# Patient Record
Sex: Female | Born: 1935 | Race: White | Hispanic: No | Smoking: Never smoker
Health system: Southern US, Community
[De-identification: ages and names within clinical notes are randomized; demographics above are authoritative.]

## PROBLEM LIST (undated history)

## (undated) DIAGNOSIS — E119 Type 2 diabetes mellitus without complications: Secondary | ICD-10-CM

## (undated) DIAGNOSIS — I10 Essential (primary) hypertension: Secondary | ICD-10-CM

## (undated) DIAGNOSIS — E785 Hyperlipidemia, unspecified: Secondary | ICD-10-CM

## (undated) DIAGNOSIS — E039 Hypothyroidism, unspecified: Secondary | ICD-10-CM

## (undated) HISTORY — DX: Essential (primary) hypertension: I10

## (undated) HISTORY — PX: CATARACT EXTRACTION: SUR2

## (undated) HISTORY — PX: OTHER SURGICAL HISTORY: SHX169

## (undated) HISTORY — DX: Type 2 diabetes mellitus without complications: E11.9

## (undated) HISTORY — DX: Hyperlipidemia, unspecified: E78.5

## (undated) HISTORY — DX: Hypothyroidism, unspecified: E03.9

## (undated) HISTORY — PX: TONSILLECTOMY: SUR1361

---

## 2015-05-15 LAB — HEMOGLOBIN A1C: Hemoglobin A1C: 10.2

## 2015-07-01 ENCOUNTER — Ambulatory Visit (INDEPENDENT_AMBULATORY_CARE_PROVIDER_SITE_OTHER): Payer: Medicare Other | Admitting: "Endocrinology

## 2015-07-01 ENCOUNTER — Encounter: Payer: Self-pay | Admitting: "Endocrinology

## 2015-07-01 VITALS — BP 144/87 | HR 83 | Ht 64.0 in | Wt 209.0 lb

## 2015-07-01 DIAGNOSIS — E1165 Type 2 diabetes mellitus with hyperglycemia: Secondary | ICD-10-CM

## 2015-07-01 DIAGNOSIS — I10 Essential (primary) hypertension: Secondary | ICD-10-CM

## 2015-07-01 DIAGNOSIS — Z794 Long term (current) use of insulin: Secondary | ICD-10-CM

## 2015-07-01 DIAGNOSIS — IMO0001 Reserved for inherently not codable concepts without codable children: Secondary | ICD-10-CM | POA: Insufficient documentation

## 2015-07-01 DIAGNOSIS — E785 Hyperlipidemia, unspecified: Secondary | ICD-10-CM | POA: Diagnosis not present

## 2015-07-01 NOTE — Progress Notes (Signed)
Subjective:    Patient ID: Amber Dudley, female    DOB: 01-08-1931. Patient is being seen in consultation for management of diabetes requested by  Olen Cordial, MD  Past Medical History  Diagnosis Date  . Diabetes mellitus, type II (HCC)   . Hypothyroidism   . Hypertension   . Hyperlipidemia    Past Surgical History  Procedure Laterality Date  . Other surgical history    . Tonsillectomy    . Other surgical history      L Arm fx  . Other surgical history      L hip  . Cataract extraction     Social History   Social History  . Marital Status: Married    Spouse Name: N/A  . Number of Children: N/A  . Years of Education: N/A   Social History Main Topics  . Smoking status: Never Smoker   . Smokeless tobacco: None  . Alcohol Use: No  . Drug Use: No  . Sexual Activity: Not Asked   Other Topics Concern  . None   Social History Narrative  . None   Outpatient Encounter Prescriptions as of 07/01/2015  Medication Sig  . Cholecalciferol (VITAMIN D) 2000 units CAPS Take by mouth daily.  . furosemide (LASIX) 20 MG tablet Take 20 mg by mouth.  . Insulin Detemir (LEVEMIR FLEXTOUCH) 100 UNIT/ML Pen Inject 40 Units into the skin at bedtime.  Marland Kitchen levothyroxine (SYNTHROID, LEVOTHROID) 100 MCG tablet Take 100 mcg by mouth daily before breakfast.  . losartan (COZAAR) 100 MG tablet Take 100 mg by mouth daily.  . metFORMIN (GLUCOPHAGE) 1000 MG tablet Take 1,000 mg by mouth 2 (two) times daily with a meal.  . metoprolol succinate (TOPROL-XL) 50 MG 24 hr tablet Take 50 mg by mouth daily. Take with or immediately following a meal.  . Multiple Vitamin (MULTIVITAMIN) capsule Take 1 capsule by mouth daily.  . pravastatin (PRAVACHOL) 10 MG tablet Take 10 mg by mouth daily.  . vitamin C (ASCORBIC ACID) 500 MG tablet Take 500 mg by mouth daily.  . [DISCONTINUED] glimepiride (AMARYL) 4 MG tablet Take 4 mg by mouth 2 (two) times daily.   No facility-administered encounter medications on  file as of 07/01/2015.   ALLERGIES: No Known Allergies VACCINATION STATUS:  There is no immunization history on file for this patient.  Diabetes She presents for her initial diabetic visit. She has type 2 diabetes mellitus. Onset time: She was diagnosed at approximate age of 81 years. Her disease course has been worsening. There are no hypoglycemic associated symptoms. Pertinent negatives for hypoglycemia include no confusion, headaches, pallor or seizures. Associated symptoms include fatigue, polydipsia and polyuria. Pertinent negatives for diabetes include no chest pain and no polyphagia. There are no hypoglycemic complications. Symptoms are worsening. There are no diabetic complications. Risk factors for coronary artery disease include diabetes mellitus, dyslipidemia, hypertension, obesity and sedentary lifestyle. Her weight is stable. She is following a generally unhealthy diet. When asked about meal planning, she reported none. She has not had a previous visit with a dietitian. She never participates in exercise. Home blood sugar record trend: She did not bring any meter nor blood glucose profile to review. An ACE inhibitor/angiotensin II receptor blocker is being taken.  Hyperlipidemia This is a chronic problem. The current episode started more than 1 year ago. Pertinent negatives include no chest pain, myalgias or shortness of breath. Current antihyperlipidemic treatment includes statins. Risk factors for coronary artery disease include dyslipidemia, diabetes  mellitus, hypertension, obesity and a sedentary lifestyle.  Hypertension This is a chronic problem. The current episode started more than 1 year ago. Pertinent negatives include no chest pain, headaches, palpitations or shortness of breath. Past treatments include angiotensin blockers. Hypertensive end-organ damage includes a thyroid problem.  Thyroid Problem Presents for initial visit. Symptoms include fatigue. Patient reports no cold  intolerance, diarrhea, heat intolerance or palpitations. Past treatments include levothyroxine. The following procedures have not been performed: thyroidectomy. Her past medical history is significant for hyperlipidemia.    Review of Systems  Constitutional: Positive for fatigue. Negative for fever, chills and unexpected weight change.  HENT: Negative for trouble swallowing and voice change.   Eyes: Negative for visual disturbance.  Respiratory: Negative for cough, shortness of breath and wheezing.   Cardiovascular: Positive for leg swelling. Negative for chest pain and palpitations.  Gastrointestinal: Negative for nausea, vomiting and diarrhea.  Endocrine: Positive for polydipsia and polyuria. Negative for cold intolerance, heat intolerance and polyphagia.  Musculoskeletal: Positive for joint swelling and gait problem. Negative for myalgias and arthralgias.  Skin: Negative for color change, pallor, rash and wound.  Neurological: Negative for seizures and headaches.  Psychiatric/Behavioral: Negative for suicidal ideas and confusion.    Objective:    BP 144/87 mmHg  Pulse 83  Ht  (1.626 m)  Wt 209 lb (94.802 kg)  BMI 35.86 kg/m2  SpO2 95%  Wt Readings from Last 3 Encounters:  07/01/15 209 lb (94.802 kg)    Physical Exam  Constitutional: She is oriented to person, place, and time. She appears well-developed.  HENT:  Head: Normocephalic and atraumatic.  Eyes: EOM are normal.  Neck: Normal range of motion. Neck supple. No tracheal deviation present. No thyromegaly present.  Cardiovascular: Normal rate and regular rhythm.   Pulmonary/Chest: Effort normal and breath sounds normal.  Abdominal: Soft. Bowel sounds are normal. There is no tenderness. There is no guarding.  Musculoskeletal: Normal range of motion. She exhibits no edema.  Neurological: She is alert and oriented to person, place, and time. She has normal reflexes. No cranial nerve deficit. Coordination normal.  Skin:  Skin is warm and dry. No rash noted. No erythema. No pallor.  Psychiatric: She has a normal mood and affect. Judgment normal.      Assessment & Plan:   1. Uncontrolled type 2 diabetes mellitus without complication, with long-term current use of insulin (HCC)  - Patient has currently uncontrolled symptomatic type 2 DM since  81 years of age,  with most recent A1c of 10.1 %. Recent labs reviewed.  -patient remains at a high risk for more acute and chronic complications of diabetes which include CAD, CVA, CKD, retinopathy, and neuropathy. These are all discussed in detail with the patient.  - I have counseled the patient on diet management and weight loss, by adopting a carbohydrate restricted/protein rich diet.  - Suggestion is made for patient to avoid simple carbohydrates   from their diet including Cakes , Desserts, Ice Cream,  Soda (  diet and regular) , Sweet Tea , Candies,  Chips, Cookies, Artificial Sweeteners,   and "Sugar-free" Products . This will help patient to have stable blood glucose profile and potentially avoid unintended weight gain.  - I encouraged the patient to switch to  unprocessed or minimally processed complex starch and increased protein intake (animal or plant source), fruits, and vegetables.  - Patient is advised to stick to a routine mealtimes to eat 3 meals  a day and avoid unnecessary  snacks ( to snack only to correct hypoglycemia).  - The patient will be scheduled with Norm SaltPenny Crumpton, RDN, CDE for individualized DM education.  - I have approached patient with the following individualized plan to manage diabetes and patient agrees:   - I  will  Initiate strict monitoring of glucose  AC and HS  for 10 days. -Patient will present her meter in log to review and plan long-term care. In the meantime, I have re-adjusted her Levemir to 40 units daily at bedtime. - Patient is warned not to take insulin without proper monitoring per orders.  -Patient is encouraged to  call clinic for blood glucose levels less than 70 or above 300 mg /dl. - I will continue metformin 1000 mg by mouth twice a day, therapeutically suitable for patient.. - I will discontinue her glimepiride, risk outweighs benefit for this patient.  - Patient specific target  A1c;  LDL, HDL, Triglycerides, and  Waist Circumference were discussed in detail.  2) BP/HTN: Controlled. Continue current medications including ACEI/ARB. 3) Lipids/HPL:   continue statins. 4)  Weight/Diet: CDE Consult will be initiated , exercise, and detailed carbohydrates information provided. 5) hypothyroidism: She is clinically euthyroid I advised her to continue levothyroxine 100 g by mouth every morning.  - We discussed about correct intake of levothyroxine, at fasting, with water, separated by at least 30 minutes from breakfast, and separated by more than 4 hours from calcium, iron, multivitamins, acid reflux medications (PPIs). -Patient is made aware of the fact that thyroid hormone replacement is needed for life, dose to be adjusted by periodic monitoring of thyroid function tests.  6) Chronic Care/Health Maintenance:  -Patient is on ACEI/ARB and Statin medications and encouraged to continue to follow up with Ophthalmology, Podiatrist at least yearly or according to recommendations, and advised to   stay away from smoking. I have recommended yearly flu vaccine and pneumonia vaccination at least every 5 years; moderate intensity exercise for up to 150 minutes weekly; and  sleep for at least 7 hours a day.  - 60 minutes of time was spent on the care of this patient , 50% of which was applied for counseling on diabetes complications and their preventions.  - Patient to bring meter and  blood glucose logs during their next visit.   - I advised patient to maintain close follow up with Olen CordialHARRIS,SYDNEY M, MD for primary care needs.  Follow up plan: - Return in about 10 days (around 07/11/2015) for diabetes, high blood  pressure, high cholesterol, follow up with meter and logs- no labs.  Marquis LunchGebre , MD Phone: [phone]  Fax: [phone]   07/01/2015, 4:30 PM

## 2015-07-01 NOTE — Patient Instructions (Signed)

## 2015-07-15 ENCOUNTER — Ambulatory Visit: Payer: Medicare Other | Admitting: "Endocrinology

## 2016-03-13 ENCOUNTER — Emergency Department (HOSPITAL_COMMUNITY)
Admission: EM | Admit: 2016-03-13 | Discharge: 2016-03-13 | Disposition: A | Discharge status: Home / Self Care | Payer: Medicare Other | Attending: Emergency Medicine | Admitting: Emergency Medicine

## 2016-03-13 ENCOUNTER — Emergency Department (HOSPITAL_COMMUNITY): Payer: Medicare Other

## 2016-03-13 ENCOUNTER — Encounter (HOSPITAL_COMMUNITY): Payer: Self-pay | Admitting: Emergency Medicine

## 2016-03-13 DIAGNOSIS — E039 Hypothyroidism, unspecified: Secondary | ICD-10-CM | POA: Diagnosis not present

## 2016-03-13 DIAGNOSIS — I10 Essential (primary) hypertension: Secondary | ICD-10-CM | POA: Insufficient documentation

## 2016-03-13 DIAGNOSIS — Y999 Unspecified external cause status: Secondary | ICD-10-CM | POA: Insufficient documentation

## 2016-03-13 DIAGNOSIS — X501XXA Overexertion from prolonged static or awkward postures, initial encounter: Secondary | ICD-10-CM | POA: Insufficient documentation

## 2016-03-13 DIAGNOSIS — Z794 Long term (current) use of insulin: Secondary | ICD-10-CM | POA: Diagnosis not present

## 2016-03-13 DIAGNOSIS — E119 Type 2 diabetes mellitus without complications: Secondary | ICD-10-CM | POA: Diagnosis not present

## 2016-03-13 DIAGNOSIS — Y939 Activity, unspecified: Secondary | ICD-10-CM | POA: Diagnosis not present

## 2016-03-13 DIAGNOSIS — S40021A Contusion of right upper arm, initial encounter: Secondary | ICD-10-CM | POA: Diagnosis present

## 2016-03-13 DIAGNOSIS — Z7984 Long term (current) use of oral hypoglycemic drugs: Secondary | ICD-10-CM | POA: Insufficient documentation

## 2016-03-13 DIAGNOSIS — Z79899 Other long term (current) drug therapy: Secondary | ICD-10-CM | POA: Insufficient documentation

## 2016-03-13 DIAGNOSIS — Y929 Unspecified place or not applicable: Secondary | ICD-10-CM | POA: Diagnosis not present

## 2016-03-13 NOTE — Discharge Instructions (Signed)
Stop taking aspirin until bruising has improved. Call and make an appointment to follow-up with the orthopedics. Return immediately for worsening swelling, pain, weakness, or fever or for any concerns.

## 2016-03-13 NOTE — ED Provider Notes (Signed)
[facility]EM[facility]ENCY DEPT Provider Note   CSN: [id number] Arrival date & time: 03/13/16  1233     History   Chief Complaint Chief Complaint  Patient presents with  . Bleeding/Bruising    HPI Amber Dudley is a 82 y.o. female.  HPI Patient presents with bruising and swelling to the right upper arm. She states on Friday she was trying to raise arm and she heard a pop in her upper arm. Since that time she's had some difficulty with abduction of the right arm. She noted swelling and bruising which has extended down the arm. She has mild amount of pain. Denies any numbness or tingling. States she takes 2 baby aspirin Staley. Denies any other blood thinners. Past Medical History:  Diagnosis Date  . Diabetes mellitus, type II (HCC)   . Hyperlipidemia   . Hypertension   . Hypothyroidism     Patient Active Problem List   Diagnosis Date Noted  . Uncontrolled type 2 diabetes mellitus without complication, with long-term current use of insulin (HCC) 07/01/2015  . Essential hypertension, benign 07/01/2015  . Hyperlipidemia 07/01/2015    Past Surgical History:  Procedure Laterality Date  . CATARACT EXTRACTION    . OTHER SURGICAL HISTORY    . OTHER SURGICAL HISTORY     L hip  . OTHER SURGICAL HISTORY     L Arm fx  . TONSILLECTOMY      OB History    Gravida Para Term Preterm AB Living   3 3 3     3    SAB TAB Ectopic Multiple Live Births                   Home Medications    Prior to Admission medications   Medication Sig Start Date End Date Taking? Authorizing Provider  Cholecalciferol (VITAMIN D) 2000 units CAPS Take by mouth daily.    Historical Provider, MD  furosemide (LASIX) 20 MG tablet Take 20 mg by mouth.    Historical Provider, MD  Insulin Detemir (LEVEMIR FLEXTOUCH) 100 UNIT/ML Pen Inject 40 Units into the skin at bedtime.    Historical Provider, MD  levothyroxine (SYNTHROID, LEVOTHROID) 100 MCG tablet Take 100 mcg by mouth daily before breakfast.    Historical  Provider, MD  losartan (COZAAR) 100 MG tablet Take 100 mg by mouth daily.    Historical Provider, MD  metFORMIN (GLUCOPHAGE) 1000 MG tablet Take 1,000 mg by mouth 2 (two) times daily with a meal.    Historical Provider, MD  metoprolol succinate (TOPROL-XL) 50 MG 24 hr tablet Take 50 mg by mouth daily. Take with or immediately following a meal.    Historical Provider, MD  Multiple Vitamin (MULTIVITAMIN) capsule Take 1 capsule by mouth daily.    Historical Provider, MD  pravastatin (PRAVACHOL) 10 MG tablet Take 10 mg by mouth daily.    Historical Provider, MD  vitamin C (ASCORBIC ACID) 500 MG tablet Take 500 mg by mouth daily.    Historical Provider, MD    Family History Family History  Problem Relation Age of Onset  . Heart failure Mother   . Heart disease Father   . Stroke Father   . Diabetes Sister   . Hypertension Sister   . Heart failure Sister   . Cancer Sister     Social History Social History  Substance Use Topics  . Smoking status: Never Smoker  . Smokeless tobacco: Never Used  . Alcohol use No     Allergies   Review  of patient's allergies indicates no known allergies.   Review of Systems Review of Systems  Constitutional: Negative for chills and fever.  Respiratory: Negative for shortness of breath.   Cardiovascular: Negative for chest pain.  Gastrointestinal: Negative for abdominal pain and nausea.  Musculoskeletal: Positive for arthralgias. Negative for neck pain.  Neurological: Positive for weakness. Negative for dizziness and numbness.  Hematological: Bruises/bleeds easily.  All other systems reviewed and are negative.    Physical Exam Updated Vital Signs BP 178/89 (BP Location: Left Arm)   Pulse 84   Temp 97.7 F (36.5 C) (Oral)   Resp 20   Ht 5\' 4"  (1.626 m)   Wt 190 lb (86.2 kg)   SpO2 100%   BMI 32.61 kg/m   Physical Exam  Constitutional: She is oriented to person, place, and time. She appears well-developed and well-nourished. No distress.   HENT:  Head: Normocephalic and atraumatic.  Mouth/Throat: Oropharynx is clear and moist.  Eyes: EOM are normal. Pupils are equal, round, and reactive to light.  Neck: Normal range of motion. Neck supple.  Cardiovascular: Normal rate and regular rhythm.   Pulmonary/Chest: Effort normal and breath sounds normal.  Abdominal: Soft. Bowel sounds are normal. There is no tenderness. There is no rebound and no guarding.  Musculoskeletal: Normal range of motion. She exhibits edema and tenderness.  Patient has swelling to the upper part of the right arm at the level of the deltoid. There is some yellow ecchymosis present. More distally down the arm to the elbow patient has purple discoloration. Compartments are soft. Distal pulses are 2+. Patient has some weakness to abduction of the right arm against resistance. Appears to have full range of motion. No bony tenderness to palpation. Full range of motion of the right elbow and wrist.  Neurological: She is alert and oriented to person, place, and time.  Sensation is completely intact. Decreased strength against resistance to abduction of the right arm. Normal flexion and extension of the right elbow and wrist.  Skin: Skin is warm and dry. Capillary refill takes less than 2 seconds. No rash noted. No erythema.  Psychiatric: She has a normal mood and affect. Her behavior is normal.  Nursing note and vitals reviewed.    ED Treatments / Results  Labs (all labs ordered are listed, but only abnormal results are displayed) Labs Reviewed - No data to display  EKG  EKG Interpretation None       Radiology Dg Humerus Right  Result Date: 03/13/2016 CLINICAL DATA:  Bruising to right bicep after lifting injury. EXAM: RIGHT HUMERUS - 2+ VIEW COMPARISON:  None. FINDINGS: No evidence for humerus fracture. Degenerative changes are noted in the shoulder. Bones are diffusely demineralized. IMPRESSION: Negative. Electronically Signed   By: Kennith CenterEric  Mansell M.D.   On:  03/13/2016 13:13    Procedures Procedures (including critical care time)  Medications Ordered in ED Medications - No data to display   Initial Impression / Assessment and Plan / ED Course  I have reviewed the triage vital signs and the nursing notes.  Pertinent labs & imaging results that were available during my care of the patient were reviewed by me and considered in my medical decision making (see chart for details).  Clinical Course    Patient presents with hematoma and ecchymosis to the right arm. She has have some abduction weakness. Concern for possible rotator cuff injury with hematoma development. X-ray without any obvious bony injuries. Will advise patient holding her aspirin until her  symptoms have resolved. We'll give with a follow-up and return precautions.  Final Clinical Impressions(s) / ED Diagnoses   Final diagnoses:  Hematoma of arm, right, initial encounter    New Prescriptions New Prescriptions   No medications on file     Loren Racer, MD 03/13/16 1405

## 2016-03-13 NOTE — ED Triage Notes (Signed)
Patient c/o bruising to right bicep. Per patient lifted arm on Friday and felt pop. Large bruising noted. Patient has difficulty raising arm above head due to pain. Patient states only type of blood thinner is aspirin.

## 2016-03-15 ENCOUNTER — Encounter: Payer: Self-pay | Admitting: Orthopaedic Surgery

## 2016-03-15 ENCOUNTER — Ambulatory Visit (INDEPENDENT_AMBULATORY_CARE_PROVIDER_SITE_OTHER): Payer: Medicare Other | Admitting: Orthopaedic Surgery

## 2016-03-15 VITALS — BP 147/83 | HR 96 | Ht 62.0 in | Wt 185.0 lb

## 2016-03-15 DIAGNOSIS — S46211A Strain of muscle, fascia and tendon of other parts of biceps, right arm, initial encounter: Secondary | ICD-10-CM

## 2016-03-15 DIAGNOSIS — S46111A Strain of muscle, fascia and tendon of long head of biceps, right arm, initial encounter: Secondary | ICD-10-CM

## 2016-03-15 NOTE — Progress Notes (Signed)
Subjective: My right upper arm hurts and has bruising    Patient ID: Amber ComasPatsy V Dudley, female    DOB: 09/03/1930, 82 y.o.   MRN: [id number]  HPI She fell at home about a month ago and hurt her right arm.  She had some pain but then did well.  Two weeks ago she tripped again and fell and hit her left knee which she was carrying a lot of packages.  She fell in her doorway.  She discovered she has a depression from the ramp that goes into her house and it has dropped about an inch at the doorway causing her to fall.  She is looking into getting it fixed.  Two weeks ago she noted some slight swelling of the upper arm  She "babied" it and it did not hurt.  This past Sunday, two days ago, she had more pain and noticed a lot of bruising of the upper arm and the elbow area that was not present the day before.  She went to the ER. X-rays were done and were negative.  She is referred here.  I have reviewed the ER reports, the x-rays and x-ray report.  She has pain when using the right upper arm, more in the mid upper arm and more over the biceps muscle. She has some pain of the right shoulder.  She cannot raise the arm upwards.  She has a sling and has used ice.  She takes two baby aspirins a day and has cut back to one a day.  She has no other injury.   Review of Systems  HENT: Negative for congestion.   Respiratory: Negative for cough and shortness of breath.   Cardiovascular: Negative for chest pain and leg swelling.  Endocrine: Positive for cold intolerance.  Musculoskeletal: Positive for arthralgias.  Allergic/Immunologic: Positive for environmental allergies.   Past Medical History:  Diagnosis Date  . Diabetes mellitus, type II (HCC)   . Hyperlipidemia   . Hypertension   . Hypothyroidism     Past Surgical History:  Procedure Laterality Date  . CATARACT EXTRACTION    . OTHER SURGICAL HISTORY    . OTHER SURGICAL HISTORY     L hip  . OTHER SURGICAL HISTORY     L Arm fx  .  TONSILLECTOMY      Current Outpatient Prescriptions on File Prior to Visit  Medication Sig Dispense Refill  . Cholecalciferol (VITAMIN D) 2000 units CAPS Take by mouth daily.    . furosemide (LASIX) 20 MG tablet Take 20 mg by mouth.    . Insulin Detemir (LEVEMIR FLEXTOUCH) 100 UNIT/ML Pen Inject 40 Units into the skin at bedtime.    Marland Kitchen. levothyroxine (SYNTHROID, LEVOTHROID) 100 MCG tablet Take 100 mcg by mouth daily before breakfast.    . losartan (COZAAR) 100 MG tablet Take 100 mg by mouth daily.    . metFORMIN (GLUCOPHAGE) 1000 MG tablet Take 1,000 mg by mouth 2 (two) times daily with a meal.    . metoprolol succinate (TOPROL-XL) 50 MG 24 hr tablet Take 50 mg by mouth daily. Take with or immediately following a meal.    . Multiple Vitamin (MULTIVITAMIN) capsule Take 1 capsule by mouth daily.    . pravastatin (PRAVACHOL) 10 MG tablet Take 10 mg by mouth daily.    . vitamin C (ASCORBIC ACID) 500 MG tablet Take 500 mg by mouth daily.     No current facility-administered medications on file prior to visit.  Social History   Social History  . Marital status: Widowed    Spouse name: N/A  . Number of children: N/A  . Years of education: N/A   Occupational History  . Not on file.   Social History Main Topics  . Smoking status: Never Smoker  . Smokeless tobacco: Never Used  . Alcohol use No  . Drug use: No  . Sexual activity: Not on file   Other Topics Concern  . Not on file   Social History Narrative  . No narrative on file    Family History  Problem Relation Age of Onset  . Heart failure Mother   . Heart disease Father   . Stroke Father   . Diabetes Sister   . Hypertension Sister   . Heart failure Sister   . Cancer Sister     BP (!) 147/83   Pulse 96   Ht 5\' 2"  (1.575 m)   Wt 185 lb (83.9 kg)   BMI 33.84 kg/m      Objective:   Physical Exam  Constitutional: She is oriented to person, place, and time. She appears well-developed and well-nourished.  HENT:   Head: Normocephalic and atraumatic.  Eyes: Conjunctivae and EOM are normal. Pupils are equal, round, and reactive to light.  Neck: Normal range of motion. Neck supple.  Cardiovascular: Normal rate, regular rhythm and intact distal pulses.   Pulmonary/Chest: Effort normal.  Abdominal: Soft.  Musculoskeletal: She exhibits tenderness (The right upper arm has significiant ecchymosis extending to the elbow but not below.  NV intact. ROM of elbow full but shoulder tender past 90 forward and abduction 75.  Grips are normal.  No defect felt.  Biceps muscle tender mid portion  & shoulder).  Neurological: She is alert and oriented to person, place, and time. She displays normal reflexes. No cranial nerve deficit. She exhibits normal muscle tone. Coordination normal.  Skin: Skin is warm and dry.  Psychiatric: She has a normal mood and affect. Her behavior is normal. Judgment and thought content normal.          Assessment & Plan:   Encounter Diagnosis  Name Primary?  . Biceps tendon rupture, right, initial encounter Yes   I have explained what the diagnosis is and why I say this.  I have told her that surgery is not usually necessary.  She needs to let it "calm down".  She is to use her sling and ice.  I will see her in two weeks.  At that time, she may need PT.  She might need MRI in the future.  Call if any problem.  Precautions discussed.  Electronically Signed Darreld Mclean, MD 9/12/201710:54 AM

## 2016-03-29 ENCOUNTER — Ambulatory Visit (INDEPENDENT_AMBULATORY_CARE_PROVIDER_SITE_OTHER): Payer: Medicare Other | Admitting: Orthopaedic Surgery

## 2016-03-29 ENCOUNTER — Encounter: Payer: Self-pay | Admitting: Orthopaedic Surgery

## 2016-03-29 VITALS — BP 151/83 | HR 67 | Temp 97.3°F | Ht 61.0 in | Wt 181.0 lb

## 2016-03-29 DIAGNOSIS — S46111A Strain of muscle, fascia and tendon of long head of biceps, right arm, initial encounter: Secondary | ICD-10-CM | POA: Diagnosis not present

## 2016-03-29 DIAGNOSIS — S46211A Strain of muscle, fascia and tendon of other parts of biceps, right arm, initial encounter: Secondary | ICD-10-CM

## 2016-03-29 NOTE — Progress Notes (Signed)
Patient [name], female DOB:[birth year], 82 y.o. JYN:[id number]  Chief Complaint  Patient presents with  . Follow-up    right arm    HPI  Amber Dudley is a 82 y.o. female who has right biceps tendon rupture.  She is better.  She has resolved ecchymosis.  She has been doing some home exercises.  She would rather continue them than go to PT/OT now.  I have shown her about walking her fingers up the wall.  She has no paresthesias.  She has more pain at night. HPI  Body mass index is 34.2 kg/m.  ROS  Review of Systems  HENT: Negative for congestion.   Respiratory: Negative for cough and shortness of breath.   Cardiovascular: Negative for chest pain and leg swelling.  Endocrine: Positive for cold intolerance.  Musculoskeletal: Positive for arthralgias.  Allergic/Immunologic: Positive for environmental allergies.    Past Medical History:  Diagnosis Date  . Diabetes mellitus, type II (HCC)   . Hyperlipidemia   . Hypertension   . Hypothyroidism     Past Surgical History:  Procedure Laterality Date  . CATARACT EXTRACTION    . OTHER SURGICAL HISTORY    . OTHER SURGICAL HISTORY     L hip  . OTHER SURGICAL HISTORY     L Arm fx  . TONSILLECTOMY      Family History  Problem Relation Age of Onset  . Heart failure Mother   . Heart disease Father   . Stroke Father   . Diabetes Sister   . Hypertension Sister   . Heart failure Sister   . Cancer Sister     Social History Social History  Substance Use Topics  . Smoking status: Never Smoker  . Smokeless tobacco: Never Used  . Alcohol use No    No Known Allergies  Current Outpatient Prescriptions  Medication Sig Dispense Refill  . Cholecalciferol (VITAMIN D) 2000 units CAPS Take by mouth daily.    . furosemide (LASIX) 20 MG tablet Take 20 mg by mouth.    . Insulin Detemir (LEVEMIR FLEXTOUCH) 100 UNIT/ML Pen Inject 40 Units into the skin at bedtime.    Marland Kitchen levothyroxine (SYNTHROID, LEVOTHROID) 100 MCG tablet Take  100 mcg by mouth daily before breakfast.    . losartan (COZAAR) 100 MG tablet Take 100 mg by mouth daily.    . metFORMIN (GLUCOPHAGE) 1000 MG tablet Take 1,000 mg by mouth 2 (two) times daily with a meal.    . metoprolol succinate (TOPROL-XL) 50 MG 24 hr tablet Take 50 mg by mouth daily. Take with or immediately following a meal.    . Multiple Vitamin (MULTIVITAMIN) capsule Take 1 capsule by mouth daily.    . pravastatin (PRAVACHOL) 10 MG tablet Take 10 mg by mouth daily.    . vitamin C (ASCORBIC ACID) 500 MG tablet Take 500 mg by mouth daily.     No current facility-administered medications for this visit.      Physical Exam  Blood pressure (!) 151/83, pulse 67, temperature 97.3 F (36.3 C), height 5\' 1"  (1.549 m), weight 181 lb (82.1 kg).  Constitutional: overall normal hygiene, normal nutrition, well developed, normal grooming, normal body habitus. Assistive device:none  Musculoskeletal: gait and station Limp none, muscle tone and strength are normal, no tremors or atrophy is present.  .  Neurological: coordination overall normal.  Deep tendon reflex/nerve stretch intact.  Sensation normal.  Cranial nerves II-XII intact.   Skin:   Normal overall no scars, lesions,  ulcers or rashes. No psoriasis.  Psychiatric: Alert and oriented x 3.  Recent memory intact, remote memory unclear.  Normal mood and affect. Well groomed.  Good eye contact.  Cardiovascular: overall no swelling, no varicosities, no edema bilaterally, normal temperatures of the legs and arms, no clubbing, cyanosis and good capillary refill.  Lymphatic: palpation is normal.  She has resolving ecchymosis around the elbow on the right.  She has normal NV status. She lacks full extension of elbow by about 5 degrees.  ROM of the shoulder is tender and decreased.  The patient has been educated about the nature of the problem(s) and counseled on treatment options.  The patient appeared to understand what I have discussed and is  in agreement with it.  Encounter Diagnosis  Name Primary?  . Biceps tendon rupture, right, initial encounter Yes    PLAN Call if any problems.  Precautions discussed.  Continue current medications.   Return to clinic 2 weeks   Electronically Signed Darreld McleanWayne , MD 9/26/201711:32 AM

## 2016-04-12 ENCOUNTER — Ambulatory Visit (INDEPENDENT_AMBULATORY_CARE_PROVIDER_SITE_OTHER): Payer: Medicare Other | Admitting: Orthopaedic Surgery

## 2016-04-12 ENCOUNTER — Encounter: Payer: Self-pay | Admitting: Orthopaedic Surgery

## 2016-04-12 VITALS — BP 159/62 | HR 75 | Ht 61.5 in | Wt 186.0 lb

## 2016-04-12 DIAGNOSIS — S46211A Strain of muscle, fascia and tendon of other parts of biceps, right arm, initial encounter: Secondary | ICD-10-CM | POA: Diagnosis not present

## 2016-04-12 NOTE — Progress Notes (Signed)
Patient Amber Dudley, female DOB:[birth year], 82 y.o. AVW:[id number]  Chief Complaint  Patient presents with  . Follow-up    Right biceps tendon    HPI  Amber Dudley is a 82 y.o. female who has had right biceps tendon rupture. She is doing very well.  She has no swelling now and the ecchymosis has resolved.  She has no new trauma. HPI  Body mass index is 34.58 kg/m.  ROS  Review of Systems  HENT: Negative for congestion.   Respiratory: Negative for cough and shortness of breath.   Cardiovascular: Negative for chest pain and leg swelling.  Endocrine: Positive for cold intolerance.  Musculoskeletal: Positive for arthralgias.  Allergic/Immunologic: Positive for environmental allergies.    Past Medical History:  Diagnosis Date  . Diabetes mellitus, type II (HCC)   . Hyperlipidemia   . Hypertension   . Hypothyroidism     Past Surgical History:  Procedure Laterality Date  . CATARACT EXTRACTION    . OTHER SURGICAL HISTORY    . OTHER SURGICAL HISTORY     L hip  . OTHER SURGICAL HISTORY     L Arm fx  . TONSILLECTOMY      Family History  Problem Relation Age of Onset  . Heart failure Mother   . Heart disease Father   . Stroke Father   . Diabetes Sister   . Hypertension Sister   . Heart failure Sister   . Cancer Sister     Social History Social History  Substance Use Topics  . Smoking status: Never Smoker  . Smokeless tobacco: Never Used  . Alcohol use No    No Known Allergies  Current Outpatient Prescriptions  Medication Sig Dispense Refill  . Cholecalciferol (VITAMIN D) 2000 units CAPS Take by mouth daily.    . furosemide (LASIX) 20 MG tablet Take 20 mg by mouth.    . Insulin Detemir (LEVEMIR FLEXTOUCH) 100 UNIT/ML Pen Inject 40 Units into the skin at bedtime.    Marland Kitchen levothyroxine (SYNTHROID, LEVOTHROID) 100 MCG tablet Take 100 mcg by mouth daily before breakfast.    . losartan (COZAAR) 100 MG tablet Take 100 mg by mouth daily.    . metFORMIN  (GLUCOPHAGE) 1000 MG tablet Take 1,000 mg by mouth 2 (two) times daily with a meal.    . metoprolol succinate (TOPROL-XL) 50 MG 24 hr tablet Take 50 mg by mouth daily. Take with or immediately following a meal.    . Multiple Vitamin (MULTIVITAMIN) capsule Take 1 capsule by mouth daily.    . pravastatin (PRAVACHOL) 10 MG tablet Take 10 mg by mouth daily.    . vitamin C (ASCORBIC ACID) 500 MG tablet Take 500 mg by mouth daily.     No current facility-administered medications for this visit.      Physical Exam  Blood pressure (!) 159/62, pulse 75, height 5' 1.5" (1.562 m), weight 186 lb (84.4 kg).  Constitutional: overall normal hygiene, normal nutrition, well developed, normal grooming, normal body habitus. Assistive device:none  Musculoskeletal: gait and station Limp none, muscle tone and strength are normal, no tremors or atrophy is present.  .  Neurological: coordination overall normal.  Deep tendon reflex/nerve stretch intact.  Sensation normal.  Cranial nerves II-XII intact.   Skin:   Normal overall no scars, lesions, ulcers or rashes. No psoriasis.  Psychiatric: Alert and oriented x 3.  Recent memory intact, remote memory unclear.  Normal mood and affect. Well groomed.  Good eye contact.  Cardiovascular: overall  no swelling, no varicosities, no edema bilaterally, normal temperatures of the legs and arms, no clubbing, cyanosis and good capillary refill.  Lymphatic: palpation is normal.  Her right upper arm has much less pain.  She has near full ROM of the right shoulder.  She has no ecchymosis now of the right upper arm and elbow. ROM of the elbow is full.  NV intact.  The patient has been educated about the nature of the problem(s) and counseled on treatment options.  The patient appeared to understand what I have discussed and is in agreement with it.  Encounter Diagnosis  Name Primary?  . Biceps tendon rupture, right, initial encounter Yes    PLAN Call if any problems.   Precautions discussed.  Continue current medications.   Return to clinic 1 month   Electronically Signed Darreld McleanWayne , MD 10/10/201710:58 AM

## 2016-05-10 ENCOUNTER — Encounter: Payer: Self-pay | Admitting: Orthopaedic Surgery

## 2016-05-10 ENCOUNTER — Ambulatory Visit (INDEPENDENT_AMBULATORY_CARE_PROVIDER_SITE_OTHER): Payer: Medicare Other | Admitting: Orthopaedic Surgery

## 2016-05-10 VITALS — BP 144/69 | HR 73 | Temp 97.2°F | Ht 61.5 in | Wt 183.0 lb

## 2016-05-10 DIAGNOSIS — S46211D Strain of muscle, fascia and tendon of other parts of biceps, right arm, subsequent encounter: Secondary | ICD-10-CM | POA: Diagnosis not present

## 2016-05-10 NOTE — Progress Notes (Signed)
Patient QM:VHQIO:Amber Dudley, female DOB:06/04/1931, 82 y.o. NGE:[id number]  Chief Complaint  Patient presents with  . Follow-up    Right Biceps Tendon    HPI  Amber Dudley is a 82 y.o. female who has a biceps tendon rupture on the right. She is doing well now. She has regained most of her strength and motion. She has no pain.   HPI  Body mass index is 34.02 kg/m.  ROS  Review of Systems  HENT: Negative for congestion.   Respiratory: Negative for cough and shortness of breath.   Cardiovascular: Negative for chest pain and leg swelling.  Endocrine: Positive for cold intolerance.  Musculoskeletal: Positive for arthralgias.  Allergic/Immunologic: Positive for environmental allergies.    Past Medical History:  Diagnosis Date  . Diabetes mellitus, type II (HCC)   . Hyperlipidemia   . Hypertension   . Hypothyroidism     Past Surgical History:  Procedure Laterality Date  . CATARACT EXTRACTION    . OTHER SURGICAL HISTORY    . OTHER SURGICAL HISTORY     L hip  . OTHER SURGICAL HISTORY     L Arm fx  . TONSILLECTOMY      Family History  Problem Relation Age of Onset  . Heart failure Mother   . Heart disease Father   . Stroke Father   . Diabetes Sister   . Hypertension Sister   . Heart failure Sister   . Cancer Sister     Social History Social History  Substance Use Topics  . Smoking status: Never Smoker  . Smokeless tobacco: Never Used  . Alcohol use No    No Known Allergies  Current Outpatient Prescriptions  Medication Sig Dispense Refill  . Cholecalciferol (VITAMIN D) 2000 units CAPS Take by mouth daily.    . furosemide (LASIX) 20 MG tablet Take 20 mg by mouth.    . Insulin Detemir (LEVEMIR FLEXTOUCH) 100 UNIT/ML Pen Inject 40 Units into the skin at bedtime.    Marland Kitchen. levothyroxine (SYNTHROID, LEVOTHROID) 100 MCG tablet Take 100 mcg by mouth daily before breakfast.    . losartan (COZAAR) 100 MG tablet Take 100 mg by mouth daily.    . metFORMIN (GLUCOPHAGE) 1000  MG tablet Take 1,000 mg by mouth 2 (two) times daily with a meal.    . metoprolol succinate (TOPROL-XL) 50 MG 24 hr tablet Take 50 mg by mouth daily. Take with or immediately following a meal.    . Multiple Vitamin (MULTIVITAMIN) capsule Take 1 capsule by mouth daily.    . pravastatin (PRAVACHOL) 10 MG tablet Take 10 mg by mouth daily.    . vitamin C (ASCORBIC ACID) 500 MG tablet Take 500 mg by mouth daily.     No current facility-administered medications for this visit.      Physical Exam  Blood pressure (!) 144/69, pulse 73, temperature 97.2 F (36.2 C), height 5' 1.5" (1.562 m), weight 183 lb (83 kg).  Constitutional: overall normal hygiene, normal nutrition, well developed, normal grooming, normal body habitus. Assistive device:none  Musculoskeletal: gait and station Limp none, muscle tone and strength are normal, no tremors or atrophy is present.  .  Neurological: coordination overall normal.  Deep tendon reflex/nerve stretch intact.  Sensation normal.  Cranial nerves II-XII intact.   Skin:   Normal overall no scars, lesions, ulcers or rashes. No psoriasis.  Psychiatric: Alert and oriented x 3.  Recent memory intact, remote memory unclear.  Normal mood and affect. Well groomed.  Good  eye contact.  Cardiovascular: overall no swelling, no varicosities, no edema bilaterally, normal temperatures of the legs and arms, no clubbing, cyanosis and good capillary refill.  Lymphatic: palpation is normal.  She has full motion of the right upper extremity and no pain. NV is intact.  Neck has full motion.  The patient has been educated about the nature of the problem(s) and counseled on treatment options.  The patient appeared to understand what I have discussed and is in agreement with it.  Encounter Diagnosis  Name Primary?  . Rupture of right biceps tendon, subsequent encounter Yes    PLAN Call if any problems.  Precautions discussed.  Continue current medications.   Return to  clinic PRN   Electronically Signed Darreld McleanWayne , MD 11/7/201711:12 AM

## 2017-08-19 ENCOUNTER — Other Ambulatory Visit: Payer: Self-pay

## 2017-08-19 ENCOUNTER — Emergency Department (HOSPITAL_COMMUNITY)
Admission: EM | Admit: 2017-08-19 | Discharge: 2017-08-19 | Disposition: A | Discharge status: Home / Self Care | Payer: Medicare Other | Attending: Emergency Medicine | Admitting: Emergency Medicine

## 2017-08-19 ENCOUNTER — Encounter (HOSPITAL_COMMUNITY): Payer: Self-pay | Admitting: Emergency Medicine

## 2017-08-19 DIAGNOSIS — E039 Hypothyroidism, unspecified: Secondary | ICD-10-CM | POA: Diagnosis not present

## 2017-08-19 DIAGNOSIS — J019 Acute sinusitis, unspecified: Secondary | ICD-10-CM | POA: Insufficient documentation

## 2017-08-19 DIAGNOSIS — Y999 Unspecified external cause status: Secondary | ICD-10-CM | POA: Diagnosis not present

## 2017-08-19 DIAGNOSIS — W0110XA Fall on same level from slipping, tripping and stumbling with subsequent striking against unspecified object, initial encounter: Secondary | ICD-10-CM | POA: Diagnosis not present

## 2017-08-19 DIAGNOSIS — E119 Type 2 diabetes mellitus without complications: Secondary | ICD-10-CM | POA: Diagnosis not present

## 2017-08-19 DIAGNOSIS — Z7982 Long term (current) use of aspirin: Secondary | ICD-10-CM | POA: Insufficient documentation

## 2017-08-19 DIAGNOSIS — Y929 Unspecified place or not applicable: Secondary | ICD-10-CM | POA: Insufficient documentation

## 2017-08-19 DIAGNOSIS — S0101XA Laceration without foreign body of scalp, initial encounter: Secondary | ICD-10-CM | POA: Diagnosis not present

## 2017-08-19 DIAGNOSIS — I10 Essential (primary) hypertension: Secondary | ICD-10-CM | POA: Insufficient documentation

## 2017-08-19 DIAGNOSIS — Z794 Long term (current) use of insulin: Secondary | ICD-10-CM | POA: Insufficient documentation

## 2017-08-19 DIAGNOSIS — Y9301 Activity, walking, marching and hiking: Secondary | ICD-10-CM | POA: Diagnosis not present

## 2017-08-19 DIAGNOSIS — S0990XA Unspecified injury of head, initial encounter: Secondary | ICD-10-CM | POA: Diagnosis present

## 2017-08-19 DIAGNOSIS — J329 Chronic sinusitis, unspecified: Secondary | ICD-10-CM

## 2017-08-19 MED ORDER — HYDROGEN PEROXIDE 3 % EX SOLN
CUTANEOUS | Status: AC
  Filled 2017-08-19: qty 473

## 2017-08-19 MED ORDER — AMOXICILLIN 500 MG PO CAPS: 500.0000 mg | ORAL_CAPSULE | Freq: Three times a day (TID) | ORAL | 0 refills | Status: DC

## 2017-08-19 MED ORDER — LIDOCAINE-EPINEPHRINE (PF) 2 %-1:200000 IJ SOLN
10.0000 mL | Freq: Once | INTRAMUSCULAR | Status: AC
  Administered 2017-08-19: 10 mL
  Filled 2017-08-19: qty 20

## 2017-08-19 MED ORDER — TRAMADOL HCL 50 MG PO TABS
50.0000 mg | ORAL_TABLET | Freq: Once | ORAL | Status: AC
  Administered 2017-08-19: 50 mg via ORAL
  Filled 2017-08-19: qty 1

## 2017-08-19 NOTE — Discharge Instructions (Signed)
[facility] out in 10 days

## 2017-08-19 NOTE — ED Triage Notes (Signed)
Pt reports she fell against clothes rack this morning and has a laceration to L scalp. Denies LOC or blood thinner use. Pt drove herself and ambulated to triage. Oozing noted.

## 2017-08-19 NOTE — ED Provider Notes (Signed)
[facility] EMERGENCY DEPARTMENT Provider Note   CSN: [id number] Arrival date & time: 08/19/17  1246     History   Chief Complaint No chief complaint on file.   HPI Amber Dudley is a 84 y.o. female.  HPI Patient presents after hitting her head this morning.  States she lost her balance and hit her head on a close rack.  Laceration to left temporal parietal area.  Had a whole lot of bleeding at home but has since stopped.  States she went to an urgent care and was told that they could not take care of her.  No confusion.  No nausea or vomiting.  She has not on anticoagulation.  Fall happened around 3 hours ago.  Last tetanus is under 10 years ago. Also complaining of pain and congestion in her left sinus area.  States it goes down to her teeth.  States she has had that for the last few weeks.  No fevers. Past Medical History:  Diagnosis Date  . Diabetes mellitus, type II (HCC)   . Hyperlipidemia   . Hypertension   . Hypothyroidism     Patient Active Problem List   Diagnosis Date Noted  . Uncontrolled type 2 diabetes mellitus without complication, with long-term current use of insulin (HCC) 07/01/2015  . Essential hypertension, benign 07/01/2015  . Hyperlipidemia 07/01/2015    Past Surgical History:  Procedure Laterality Date  . CATARACT EXTRACTION    . OTHER SURGICAL HISTORY    . OTHER SURGICAL HISTORY     L hip  . OTHER SURGICAL HISTORY     L Arm fx  . TONSILLECTOMY      OB History    Gravida Para Term Preterm AB Living   3 3 3     3    SAB TAB Ectopic Multiple Live Births                   Home Medications    Prior to Admission medications   Medication Sig Start Date End Date Taking? Authorizing Provider  aspirin EC 81 MG tablet Take 162 mg by mouth daily.   Yes [provider]  benzonatate (TESSALON) 100 MG capsule Take 1 capsule by mouth 3 (three) times daily as needed. 08/16/17  Yes [provider]  Cholecalciferol (VITAMIN D) 2000  units CAPS Take by mouth daily.   Yes [provider]  Cyanocobalamin (VITAMIN B 12) 100 MCG LOZG Take 500 mg by mouth daily.   Yes [provider]  furosemide (LASIX) 20 MG tablet Take 20 mg by mouth.   Yes [provider]  glimepiride (AMARYL) 2 MG tablet Take 0.5 tablets by mouth daily. 08/12/17  Yes [provider]  Insulin Detemir (LEVEMIR FLEXTOUCH) 100 UNIT/ML Pen Inject 15-20 Units into the skin 2 (two) times daily.    Yes [provider]  levothyroxine (SYNTHROID, LEVOTHROID) 125 MCG tablet Take 0.5 tablets by mouth daily before breakfast.  07/18/17  Yes [provider]  losartan (COZAAR) 100 MG tablet Take 100 mg by mouth daily.   Yes [provider]  metFORMIN (GLUCOPHAGE) 1000 MG tablet Take 1,000 mg by mouth 2 (two) times daily with a meal.   Yes [provider]  metoprolol succinate (TOPROL-XL) 50 MG 24 hr tablet Take 50 mg by mouth daily. Take with or immediately following a meal.   Yes [provider]  Multiple Vitamin (MULTIVITAMIN) capsule Take 1 capsule by mouth daily.   Yes [provider]  pravastatin (PRAVACHOL) 10 MG tablet Take 10 mg by mouth every other day.    Yes [provider]  vitamin C (ASCORBIC ACID) 500 MG tablet Take 500 mg by mouth daily.   Yes [provider]  amoxicillin (AMOXIL) 500 MG capsule Take 1 capsule (500 mg total) by mouth 3 (three) times daily. 08/19/17   Benjiman Core, MD    Family History Family History  Problem Relation Age of Onset  . Heart failure Mother   . Heart disease Father   . Stroke Father   . Diabetes Sister   . Hypertension Sister   . Heart failure Sister   . Cancer Sister     Social History Social History   Tobacco Use  . Smoking status: Never Smoker  . Smokeless tobacco: Never Used  Substance Use Topics  . Alcohol use: No    Alcohol/week: 0.0 oz  . Drug use: No     Allergies   Patient has no known  allergies.   Review of Systems Review of Systems  Constitutional: Negative for appetite change.  HENT: Positive for congestion. Negative for dental problem, facial swelling and trouble swallowing.        Left facial pain.  Respiratory: Negative for chest tightness.   Cardiovascular: Negative for chest pain.  Gastrointestinal: Negative for abdominal pain.  Musculoskeletal: Negative for neck pain.  Neurological: Negative for syncope and headaches.     Physical Exam Updated Vital Signs BP (!) 124/100 (BP Location: Right Arm)   Pulse 78   Temp 98.2 F (36.8 C) (Oral)   Resp 14   Ht 5\' 4"  (1.626 m)   Wt 83.9 kg (185 lb)   SpO2 95%   BMI 31.76 kg/m   Physical Exam  Constitutional: She appears well-developed.  HENT:  Approximately 3 cm laceration to left parietal area on scalp.  No underlying bony tenderness.  Temporal over bilateral maxillary sinuses  Eyes: EOM are normal. Pupils are equal, round, and reactive to light.  Neck: Neck supple.  Cardiovascular: Normal rate.  Pulmonary/Chest: Effort normal.  Abdominal: Soft.  Musculoskeletal: She exhibits no tenderness.  Neurological: She is alert.  Skin: Skin is warm.     ED Treatments / Results  Labs (all labs ordered are listed, but only abnormal results are displayed) Labs Reviewed - No data to display  EKG  EKG Interpretation None       Radiology No results found.  Procedures .Marland KitchenLaceration Repair Date/Time: 08/19/2017 3:00 PM Performed by: Benjiman Core, MD Authorized by: Benjiman Core, MD   Consent:    Consent obtained:  Verbal   Consent given by:  Patient   Risks discussed:  Infection, need for additional repair, poor cosmetic result, retained foreign body, nerve damage and pain   Alternatives discussed:  No treatment and delayed treatment Anesthesia (see MAR for exact dosages):    Anesthesia method:  Local infiltration   Local anesthetic:  Lidocaine 2% WITH epi Laceration details:     Location:  Scalp   Length (cm):  3 Repair type:    Repair type:  Simple Pre-procedure details:    Preparation:  Patient was prepped and draped in usual sterile fashion Exploration:    Wound exploration: entire depth of wound probed and visualized     Contaminated: no   Treatment:    Amount of cleaning:  Standard Skin repair:    Repair method:  Staples   Number of staples:  7 Approximation:    Approximation:  Close  Post-procedure details:    Patient tolerance of procedure:  Tolerated well, no immediate complications   (including critical care time)  Medications Ordered in ED Medications  lidocaine-EPINEPHrine (XYLOCAINE W/EPI) 2 %-1:200000 (PF) injection 10 mL (not administered)  hydrogen peroxide 3 % external solution (not administered)  traMADol (ULTRAM) tablet 50 mg (50 mg Oral Given 08/19/17 1435)     Initial Impression / Assessment and Plan / ED Course  I have reviewed the triage vital signs and the nursing notes.  Pertinent labs & imaging results that were available during my care of the patient were reviewed by me and considered in my medical decision making (see chart for details).     Patient with scalp laceration after fall.  Does not appear to need intracranial imaging.  Does however have sinus pressures.  This preceded the fall.  Has had for over a week.  Will treat with antibiotics.  Laceration closed.  Discharge home.  Tetanus is already up-to-date.  Final Clinical Impressions(s) / ED Diagnoses   Final diagnoses:  Scalp laceration, initial encounter  Sinusitis, unspecified chronicity, unspecified location    ED Discharge Orders        Ordered    amoxicillin (AMOXIL) 500 MG capsule  3 times daily     08/19/17 1458       Benjiman CorePickering, , MD 08/19/17 1501

## 2018-02-06 IMAGING — DX DG HUMERUS 2V *R*
2 series · 2 of 2 positions shown · non-contrast
Comparison: None.

CLINICAL DATA: Bruising to right bicep after lifting injury.

EXAM:
RIGHT HUMERUS - 2+ VIEW

[humerus ap]
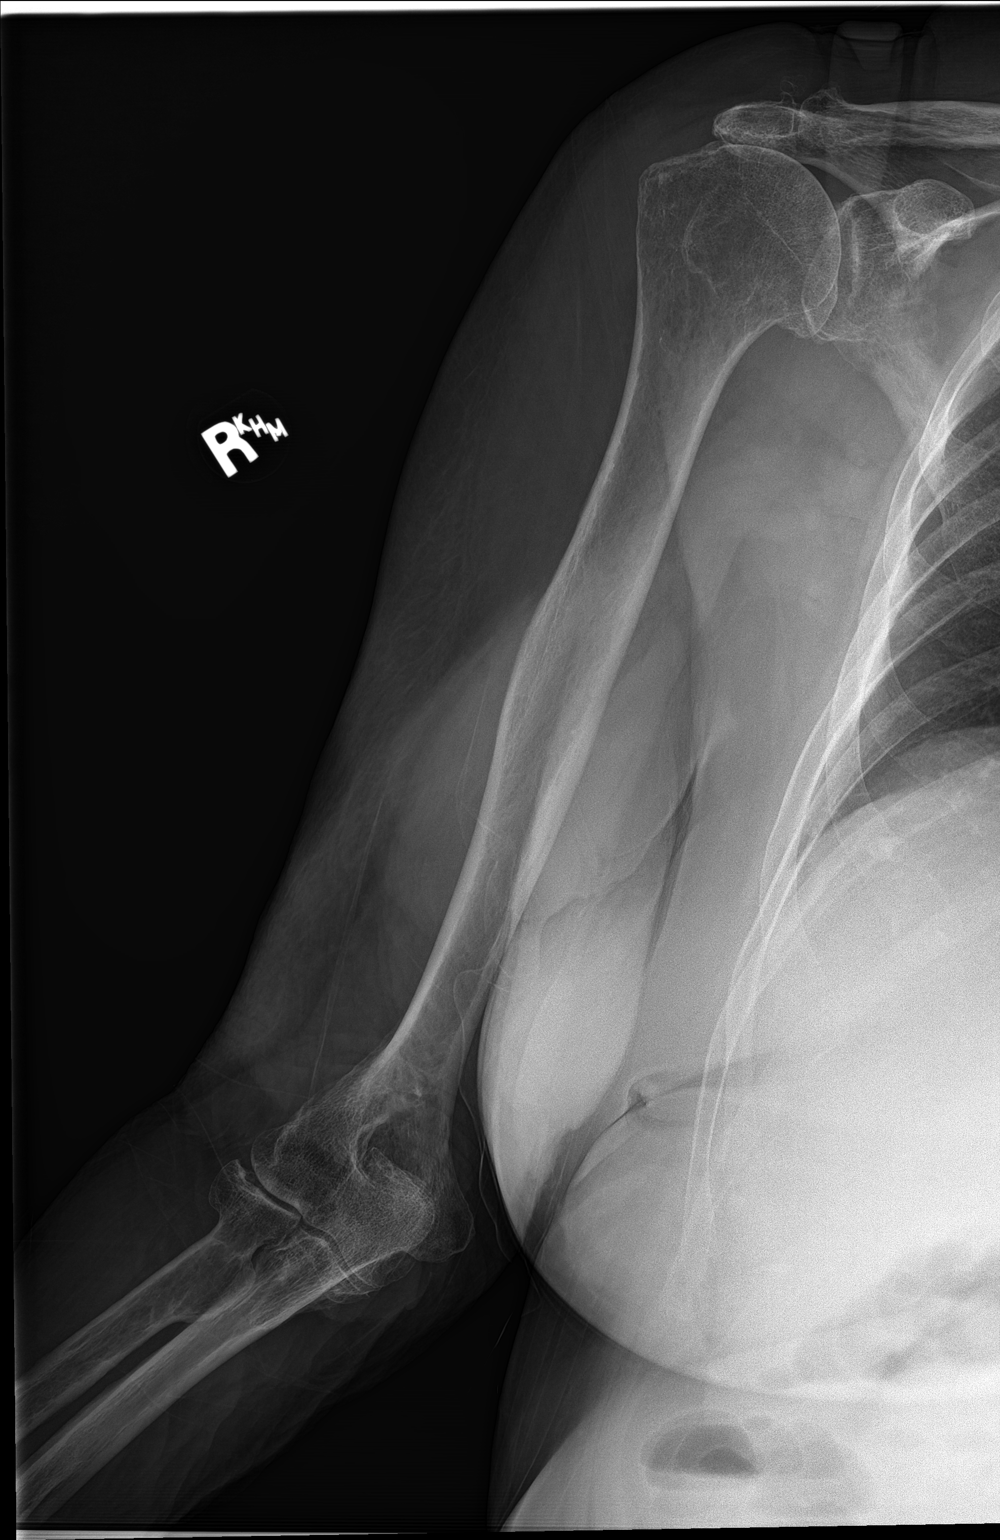

[humerus lat]
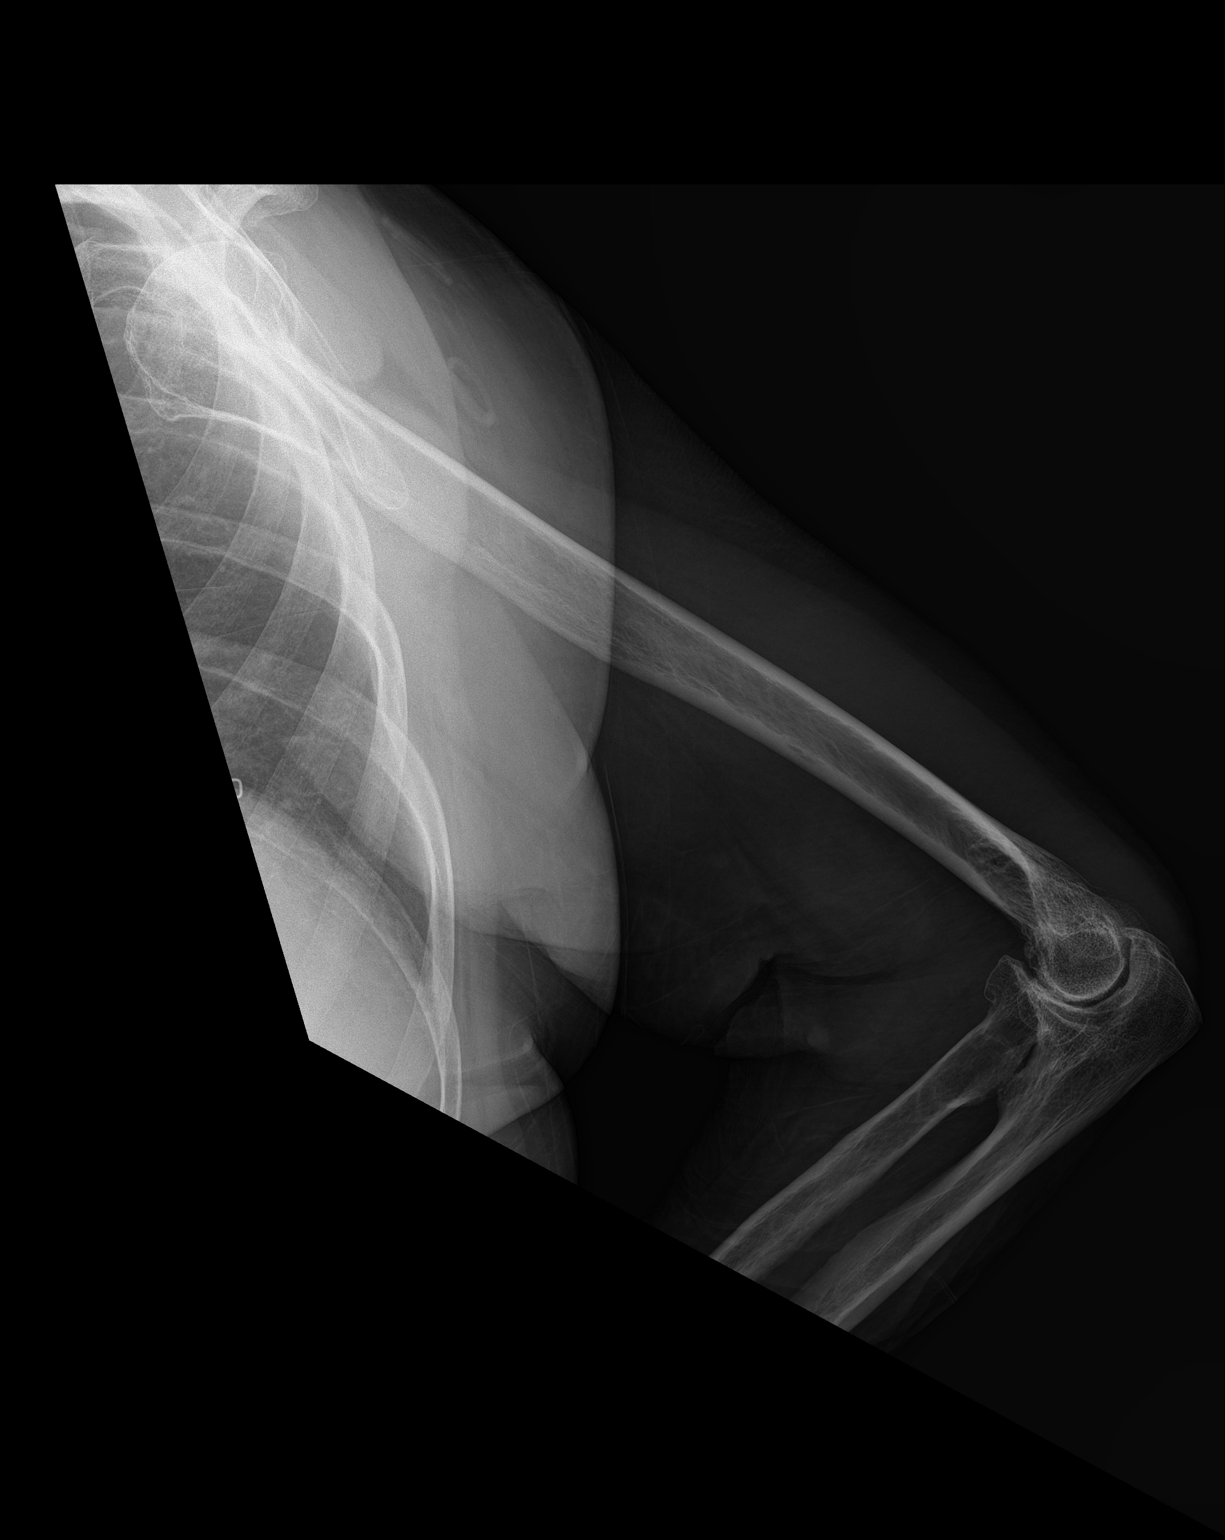

[2 of 2 positions shown; findings below may reference images not displayed]

FINDINGS: No evidence for humerus fracture. Degenerative changes are noted in
the shoulder. Bones are diffusely demineralized.
IMPRESSION: Negative.

## 2019-02-16 ENCOUNTER — Emergency Department (HOSPITAL_COMMUNITY)
Admission: EM | Admit: 2019-02-16 | Discharge: 2019-02-16 | Disposition: A | Discharge status: Home / Self Care | Payer: Medicare Other | Attending: Emergency Medicine | Admitting: Emergency Medicine

## 2019-02-16 ENCOUNTER — Other Ambulatory Visit: Payer: Self-pay

## 2019-02-16 ENCOUNTER — Encounter (HOSPITAL_COMMUNITY): Payer: Self-pay | Admitting: Emergency Medicine

## 2019-02-16 DIAGNOSIS — Z7982 Long term (current) use of aspirin: Secondary | ICD-10-CM | POA: Diagnosis not present

## 2019-02-16 DIAGNOSIS — Z203 Contact with and (suspected) exposure to rabies: Secondary | ICD-10-CM | POA: Diagnosis not present

## 2019-02-16 DIAGNOSIS — Y999 Unspecified external cause status: Secondary | ICD-10-CM | POA: Diagnosis not present

## 2019-02-16 DIAGNOSIS — E119 Type 2 diabetes mellitus without complications: Secondary | ICD-10-CM | POA: Diagnosis not present

## 2019-02-16 DIAGNOSIS — E785 Hyperlipidemia, unspecified: Secondary | ICD-10-CM | POA: Diagnosis not present

## 2019-02-16 DIAGNOSIS — W5501XA Bitten by cat, initial encounter: Secondary | ICD-10-CM | POA: Insufficient documentation

## 2019-02-16 DIAGNOSIS — I10 Essential (primary) hypertension: Secondary | ICD-10-CM | POA: Diagnosis not present

## 2019-02-16 DIAGNOSIS — Y929 Unspecified place or not applicable: Secondary | ICD-10-CM | POA: Diagnosis not present

## 2019-02-16 DIAGNOSIS — Z23 Encounter for immunization: Secondary | ICD-10-CM | POA: Insufficient documentation

## 2019-02-16 DIAGNOSIS — Y939 Activity, unspecified: Secondary | ICD-10-CM | POA: Diagnosis not present

## 2019-02-16 DIAGNOSIS — S61259A Open bite of unspecified finger without damage to nail, initial encounter: Secondary | ICD-10-CM

## 2019-02-16 DIAGNOSIS — S61256A Open bite of right little finger without damage to nail, initial encounter: Secondary | ICD-10-CM | POA: Insufficient documentation

## 2019-02-16 DIAGNOSIS — E039 Hypothyroidism, unspecified: Secondary | ICD-10-CM | POA: Insufficient documentation

## 2019-02-16 DIAGNOSIS — Z2914 Encounter for prophylactic rabies immune globin: Secondary | ICD-10-CM | POA: Diagnosis not present

## 2019-02-16 DIAGNOSIS — Z79899 Other long term (current) drug therapy: Secondary | ICD-10-CM | POA: Insufficient documentation

## 2019-02-16 DIAGNOSIS — Z794 Long term (current) use of insulin: Secondary | ICD-10-CM | POA: Insufficient documentation

## 2019-02-16 MED ORDER — RABIES IMMUNE GLOBULIN 150 UNIT/ML IM INJ
20.0000 [IU]/kg | INJECTION | Freq: Once | INTRAMUSCULAR | Status: AC
  Administered 2019-02-16: 14:00:00 1800 [IU] via INTRAMUSCULAR
  Filled 2019-02-16: qty 12

## 2019-02-16 MED ORDER — AMOXICILLIN-POT CLAVULANATE 875-125 MG PO TABS: 1.0000 | ORAL_TABLET | Freq: Two times a day (BID) | ORAL | 0 refills | Status: AC

## 2019-02-16 MED ORDER — TETANUS-DIPHTH-ACELL PERTUSSIS 5-2.5-18.5 LF-MCG/0.5 IM SUSP
0.5000 mL | Freq: Once | INTRAMUSCULAR | Status: AC
Start: 2019-02-16 — End: 2019-02-16
  Administered 2019-02-16: 13:00:00 0.5 mL via INTRAMUSCULAR
  Filled 2019-02-16: qty 0.5

## 2019-02-16 MED ORDER — RABIES VACCINE, PCEC IM SUSR
1.0000 mL | Freq: Once | INTRAMUSCULAR | Status: AC
  Administered 2019-02-16: 14:00:00 1 mL via INTRAMUSCULAR
  Filled 2019-02-16: qty 1

## 2019-02-16 NOTE — Discharge Instructions (Addendum)
Take antibiotics as prescribed.  Take the entire course, even if your symptoms improve. Keep clean with soap and water. Follow-up with urgent care listed below to complete your rabies vaccination.  The schedule for this is in the paperwork, you will need to go 3 more times.  Follow-up with your primary care doctor as needed for further evaluation of the wound. Return to the emergency room if you develop severe worsening symptoms such as fevers, pus draining from the wound, inability to move your finger, any new, worsening, concerning symptoms.

## 2019-02-16 NOTE — ED Provider Notes (Signed)
[facility] EMERGENCY DEPARTMENT Provider Note   CSN: [id number] Arrival date & time: 02/16/19  1043    History   Chief Complaint Chief Complaint  Patient presents with  . Animal Bite    HPI Amber Dudley is a 85 y.o. female presenting for evaluation of cat bite.  Patient states 2 days ago she was bitten on the right pinky.  She reports mild increase in pain and swelling.  She washed it with alcohol and peroxide, has been keeping a Band-Aid on it.  No drainage.  She has numbness of this finger from a previous injury, no change.  No pain with movement. She is not on a blood thinner. She denies injury elsewhere. Cat was a farm cat-- likely has not had rabies vaccines. She has a h/o DM, bgl has been baseline for her.      HPI  Past Medical History:  Diagnosis Date  . Diabetes mellitus, type II (HCC)   . Hyperlipidemia   . Hypertension   . Hypothyroidism     Patient Active Problem List   Diagnosis Date Noted  . Uncontrolled type 2 diabetes mellitus without complication, with long-term current use of insulin (HCC) 07/01/2015  . Essential hypertension, benign 07/01/2015  . Hyperlipidemia 07/01/2015    Past Surgical History:  Procedure Laterality Date  . CATARACT EXTRACTION    . OTHER SURGICAL HISTORY    . OTHER SURGICAL HISTORY     L hip  . OTHER SURGICAL HISTORY     L Arm fx  . TONSILLECTOMY       OB History    Gravida  3   Para  3   Term  3   Preterm      AB      Living  3     SAB      TAB      Ectopic      Multiple      Live Births               Home Medications    Prior to Admission medications   Medication Sig Start Date End Date Taking? Authorizing Provider  amoxicillin (AMOXIL) 500 MG capsule Take 1 capsule (500 mg total) by mouth 3 (three) times daily. 08/19/17   Benjiman CorePickering, Nathan, MD  amoxicillin-clavulanate (AUGMENTIN) 875-125 MG tablet Take 1 tablet by mouth every 12 (twelve) hours for 10 days. 02/16/19 02/26/19  ,  , PA-C  aspirin EC 81 MG tablet Take 162 mg by mouth daily.    [provider]  benzonatate (TESSALON) 100 MG capsule Take 1 capsule by mouth 3 (three) times daily as needed. 08/16/17   [provider]  Cholecalciferol (VITAMIN D) 2000 units CAPS Take by mouth daily.    [provider]  Cyanocobalamin (VITAMIN B 12) 100 MCG LOZG Take 500 mg by mouth daily.    [provider]  furosemide (LASIX) 20 MG tablet Take 20 mg by mouth.    [provider]  glimepiride (AMARYL) 2 MG tablet Take 0.5 tablets by mouth daily. 08/12/17   [provider]  Insulin Detemir (LEVEMIR FLEXTOUCH) 100 UNIT/ML Pen Inject 15-20 Units into the skin 2 (two) times daily.     [provider]  levothyroxine (SYNTHROID, LEVOTHROID) 125 MCG tablet Take 0.5 tablets by mouth daily before breakfast.  07/18/17   [provider]  losartan (COZAAR) 100 MG tablet Take 100 mg by mouth daily.    [provider]  metFORMIN (GLUCOPHAGE)  1000 MG tablet Take 1,000 mg by mouth 2 (two) times daily with a meal.    [provider]  metoprolol succinate (TOPROL-XL) 50 MG 24 hr tablet Take 50 mg by mouth daily. Take with or immediately following a meal.    [provider]  Multiple Vitamin (MULTIVITAMIN) capsule Take 1 capsule by mouth daily.    [provider]  pravastatin (PRAVACHOL) 10 MG tablet Take 10 mg by mouth every other day.     [provider]  vitamin C (ASCORBIC ACID) 500 MG tablet Take 500 mg by mouth daily.    [provider]    Family History Family History  Problem Relation Age of Onset  . Heart failure Mother   . Heart disease Father   . Stroke Father   . Diabetes Sister   . Hypertension Sister   . Heart failure Sister   . Cancer Sister     Social History Social History   Tobacco Use  . Smoking status: Never Smoker  . Smokeless tobacco: Never Used  Substance Use Topics  . Alcohol use: No     Alcohol/week: 0.0 standard drinks  . Drug use: No     Allergies   Patient has no known allergies.   Review of Systems Review of Systems  Skin: Positive for wound.     Physical Exam Updated Vital Signs BP (!) 165/74   Pulse 74   Temp 98.2 F (36.8 C) (Oral)   Resp 16   Ht 5\' 3"  (1.6 m)   Wt 89.3 kg   SpO2 96%   BMI 34.86 kg/m   Physical Exam Vitals signs and nursing note reviewed.  Constitutional:      General: She is not in acute distress.    Appearance: She is well-developed.  HENT:     Head: Normocephalic and atraumatic.  Neck:     Musculoskeletal: Normal range of motion.  Pulmonary:     Effort: Pulmonary effort is normal.  Abdominal:     General: There is no distension.  Musculoskeletal: Normal range of motion.  Skin:    General: Skin is warm.     Capillary Refill: Capillary refill takes less than 2 seconds.     Findings: No rash.     Comments: Small puncture wound of distal R finger with mild surrounding swelling. No warmth or induration. No drainage. no pain with passive ROM of the pinkly. Decreased sensation, pt states this is baseline.   Neurological:     Mental Status: She is alert and oriented to person, place, and time.      ED Treatments / Results  Labs (all labs ordered are listed, but only abnormal results are displayed) Labs Reviewed - No data to display  EKG None  Radiology No results found.  Procedures Procedures (including critical care time)  Medications Ordered in ED Medications  rabies immune globulin (HYPERAB/KEDRAB) injection 1,800 Units (has no administration in time range)  rabies vaccine (RABAVERT) injection 1 mL (has no administration in time range)  Tdap (BOOSTRIX) injection 0.5 mL (0.5 mLs Intramuscular Given 02/16/19 1249)     Initial Impression / Assessment and Plan / ED Course  I have reviewed the triage vital signs and the nursing notes.  Pertinent labs & imaging results that were available during my care  of the patient were reviewed by me and considered in my medical decision making (see chart for details).        Patient presenting for evaluation of cat  bite of the right pinky finger.  Physical exam reassuring, she is at her baseline neurovascularly.  Mild swelling, but no erythema or active drainage.  However, considering her age, diabetes, and cat bite of the finger, high risk for infection.  Will start patient on Augmentin.  Rabies vaccination started today, discussed further visits to be completed at urgent care.  Discussed with attending, Dr. Deretha EmoryZackowski evaluated the patient.  At this time, appears safe for discharge.  Return precautions given.  Patient states she understands and agrees to plan.  Final Clinical Impressions(s) / ED Diagnoses   Final diagnoses:  Cat bite of finger, initial encounter    ED Discharge Orders         Ordered    amoxicillin-clavulanate (AUGMENTIN) 875-125 MG tablet  Every 12 hours     02/16/19 1251           , , PA-C 02/16/19 1252    Vanetta MuldersZackowski, Scott, MD 02/17/19 1550

## 2019-02-16 NOTE — ED Provider Notes (Signed)
Medical screening examination/treatment/procedure(s) were conducted as a shared visit with non-physician practitioner(s) and myself.  I personally evaluated the patient during the encounter.      Patient seen by me along with physician assistant.  Patient get bitten by a stray cat on her right little finger 2 days ago.  Bled profusely which is good patient will need rabies vaccination and rabies immunoglobulin.  Also do recommend prophylactic antibiotics since it is on the finger.  Some minimal swelling no signs of infection currently good cap refill at the end of the finger.   Fredia Sorrow, MD 02/16/19 1246

## 2019-02-16 NOTE — ED Triage Notes (Signed)
Pt was bit by a stray cat two days ago on her right pinky. Animal controlled was not called. No rabies shots given to animals.

## 2019-02-19 ENCOUNTER — Other Ambulatory Visit: Payer: Self-pay

## 2019-02-19 ENCOUNTER — Encounter (HOSPITAL_COMMUNITY)
Admission: RE | Admit: 2019-02-19 | Discharge: 2019-02-19 | Disposition: A | Discharge status: Home / Self Care | Payer: Medicare Other | Source: Ambulatory Visit | Attending: Emergency Medicine | Admitting: Emergency Medicine

## 2019-02-19 DIAGNOSIS — T148XXA Other injury of unspecified body region, initial encounter: Secondary | ICD-10-CM | POA: Diagnosis not present

## 2019-02-19 DIAGNOSIS — W5501XA Bitten by cat, initial encounter: Secondary | ICD-10-CM | POA: Insufficient documentation

## 2019-02-19 DIAGNOSIS — Z23 Encounter for immunization: Secondary | ICD-10-CM | POA: Insufficient documentation

## 2019-02-19 DIAGNOSIS — Z203 Contact with and (suspected) exposure to rabies: Secondary | ICD-10-CM | POA: Diagnosis not present

## 2019-02-19 MED ORDER — RABIES VACCINE, PCEC IM SUSR
1.0000 mL | Freq: Once | INTRAMUSCULAR | Status: AC
  Administered 2019-02-19: 1 mL via INTRAMUSCULAR

## 2019-02-23 ENCOUNTER — Encounter (HOSPITAL_COMMUNITY)
Admission: RE | Admit: 2019-02-23 | Discharge: 2019-02-23 | Disposition: A | Discharge status: Home / Self Care | Payer: Medicare Other | Source: Ambulatory Visit | Attending: Emergency Medicine | Admitting: Emergency Medicine

## 2019-02-23 ENCOUNTER — Other Ambulatory Visit: Payer: Self-pay

## 2019-02-23 DIAGNOSIS — Z23 Encounter for immunization: Secondary | ICD-10-CM | POA: Diagnosis not present

## 2019-02-23 MED ORDER — RABIES VACCINE, PCEC IM SUSR
1.0000 mL | Freq: Once | INTRAMUSCULAR | Status: AC
  Administered 2019-02-23: 1 mL via INTRAMUSCULAR

## 2019-02-23 NOTE — Progress Notes (Addendum)
Pt arrived for outpatient rabies vaccination to be given. Will be administered in the left deltoid. VIS sheet given.

## 2019-03-02 ENCOUNTER — Encounter (HOSPITAL_COMMUNITY)
Admission: RE | Admit: 2019-03-02 | Discharge: 2019-03-02 | Disposition: A | Discharge status: Home / Self Care | Payer: Medicare Other | Source: Ambulatory Visit | Attending: Emergency Medicine | Admitting: Emergency Medicine

## 2019-03-02 ENCOUNTER — Other Ambulatory Visit: Payer: Self-pay

## 2019-03-02 DIAGNOSIS — Z23 Encounter for immunization: Secondary | ICD-10-CM | POA: Diagnosis not present

## 2019-03-02 MED ORDER — RABIES VACCINE, PCEC IM SUSR
1.0000 mL | Freq: Once | INTRAMUSCULAR | Status: AC
  Administered 2019-03-02: 11:00:00 1 mL via INTRAMUSCULAR
  Filled 2019-03-02: qty 1

## 2019-06-25 ENCOUNTER — Emergency Department (HOSPITAL_COMMUNITY): Payer: Medicare Other

## 2019-06-25 ENCOUNTER — Encounter (HOSPITAL_COMMUNITY): Payer: Self-pay

## 2019-06-25 ENCOUNTER — Emergency Department (HOSPITAL_COMMUNITY)
Admission: EM | Admit: 2019-06-25 | Discharge: 2019-06-25 | Disposition: A | Discharge status: Home / Self Care | Payer: Medicare Other | Attending: Emergency Medicine | Admitting: Emergency Medicine

## 2019-06-25 ENCOUNTER — Other Ambulatory Visit: Payer: Self-pay

## 2019-06-25 DIAGNOSIS — Z79899 Other long term (current) drug therapy: Secondary | ICD-10-CM | POA: Insufficient documentation

## 2019-06-25 DIAGNOSIS — Z7982 Long term (current) use of aspirin: Secondary | ICD-10-CM | POA: Insufficient documentation

## 2019-06-25 DIAGNOSIS — M79642 Pain in left hand: Secondary | ICD-10-CM | POA: Insufficient documentation

## 2019-06-25 DIAGNOSIS — M7989 Other specified soft tissue disorders: Secondary | ICD-10-CM | POA: Diagnosis not present

## 2019-06-25 DIAGNOSIS — I10 Essential (primary) hypertension: Secondary | ICD-10-CM | POA: Insufficient documentation

## 2019-06-25 DIAGNOSIS — E039 Hypothyroidism, unspecified: Secondary | ICD-10-CM | POA: Diagnosis not present

## 2019-06-25 DIAGNOSIS — E119 Type 2 diabetes mellitus without complications: Secondary | ICD-10-CM | POA: Insufficient documentation

## 2019-06-25 DIAGNOSIS — Z794 Long term (current) use of insulin: Secondary | ICD-10-CM | POA: Insufficient documentation

## 2019-06-25 LAB — BASIC METABOLIC PANEL
Anion gap: 13 (ref 5–15)
BUN: 15 mg/dL (ref 8–23)
CO2: 28 mmol/L (ref 22–32)
Calcium: 9.1 mg/dL (ref 8.9–10.3)
Chloride: 99 mmol/L (ref 98–111)
Creatinine, Ser: 0.68 mg/dL (ref 0.44–1.00)
GFR calc Af Amer: >60 mL/min (ref >60)
GFR calc non Af Amer: >60 mL/min (ref >60)
Glucose, Bld: 156 mg/dL — ABNORMAL HIGH (ref 70–99)
Potassium: 3.6 mmol/L (ref 3.5–5.1)
Sodium: 140 mmol/L (ref 135–145)

## 2019-06-25 LAB — CBC WITH DIFFERENTIAL/PLATELET
Abs Immature Granulocytes: 0.03 10*3/uL (ref 0.00–0.07)
Basophils Absolute: 0.1 10*3/uL (ref 0.0–0.1)
Basophils Relative: 1 %
Eosinophils Absolute: 0 10*3/uL (ref 0.0–0.5)
Eosinophils Relative: 0 %
HCT: 41.7 % (ref 36.0–46.0)
Hemoglobin: 13.2 g/dL (ref 12.0–15.0)
Immature Granulocytes: 0 %
Lymphocytes Relative: 8 %
Lymphs Abs: 0.8 10*3/uL (ref 0.7–4.0)
MCH: 30.2 pg (ref 26.0–34.0)
MCHC: 31.7 g/dL (ref 30.0–36.0)
MCV: 95.4 fL (ref 80.0–100.0)
Monocytes Absolute: 0.8 10*3/uL (ref 0.1–1.0)
Monocytes Relative: 8 %
Neutro Abs: 8.5 10*3/uL — ABNORMAL HIGH (ref 1.7–7.7)
Neutrophils Relative %: 83 %
Platelets: 268 10*3/uL (ref 150–400)
RBC: 4.37 MIL/uL (ref 3.87–5.11)
RDW: 13.3 % (ref 11.5–15.5)
WBC: 10.2 10*3/uL (ref 4.0–10.5)
nRBC: 0 % (ref 0.0–0.2)

## 2019-06-25 MED ORDER — IBUPROFEN 400 MG PO TABS: 400.0000 mg | ORAL_TABLET | Freq: Three times a day (TID) | ORAL | 0 refills | Status: DC | PRN

## 2019-06-25 MED ORDER — HYDROCODONE-ACETAMINOPHEN 5-325 MG PO TABS: 1.0000 | ORAL_TABLET | Freq: Once | ORAL | Status: DC

## 2019-06-25 MED ORDER — ACETAMINOPHEN 325 MG PO TABS
650.0000 mg | ORAL_TABLET | Freq: Once | ORAL | Status: AC
  Administered 2019-06-25: 15:00:00 650 mg via ORAL
  Filled 2019-06-25: qty 2

## 2019-06-25 NOTE — ED Provider Notes (Signed)
[facility] EMERGENCY DEPARTMENT Provider Note   CSN: [id number] Arrival date & time: 06/25/19  1209     History Chief Complaint  Patient presents with  . Hand Pain    Amber Dudley is a 85 y.o. female with a hx of T2DM, HTN, hyperlipidemia, & hypothyroidism who presents to the ED with complaints of atraumatic left hand pain that began last night. Patient states that last night after going to sleep for about 2 hours she woke up with L hand pain/swelling. She does not recall any injury, wounds, or change in activity. She states sxs progressively worsened into today. Current pain is a 9/10 in severity, worse with movement, no alleviating factors. She is R hand dominant. Denies fever, chills, numbness, paresthesias, weakness, other areas of swelling, trouble breathing, or rashes. No new meds/products.   HPI     Past Medical History:  Diagnosis Date  . Diabetes mellitus, type II ([place])   . Hyperlipidemia   . Hypertension   . Hypothyroidism     Patient Active Problem List   Diagnosis Date Noted  . Uncontrolled type 2 diabetes mellitus without complication, with long-term current use of insulin 07/01/2015  . Essential hypertension, benign 07/01/2015  . Hyperlipidemia 07/01/2015    Past Surgical History:  Procedure Laterality Date  . CATARACT EXTRACTION    . OTHER SURGICAL HISTORY    . OTHER SURGICAL HISTORY     L hip  . OTHER SURGICAL HISTORY     L Arm fx  . TONSILLECTOMY       OB History    Gravida  3   Para  3   Term  3   Preterm      AB      Living  3     SAB      TAB      Ectopic      Multiple      Live Births              Family History  Problem Relation Age of Onset  . Heart failure Mother   . Heart disease Father   . Stroke Father   . Diabetes Sister   . Hypertension Sister   . Heart failure Sister   . Cancer Sister     Social History   Tobacco Use  . Smoking status: Never Smoker  . Smokeless tobacco: Never Used  Substance  Use Topics  . Alcohol use: No    Alcohol/week: 0.0 standard drinks  . Drug use: No    Home Medications Prior to Admission medications   Medication Sig Start Date End Date Taking? Authorizing Provider  amoxicillin (AMOXIL) 500 MG capsule Take 1 capsule (500 mg total) by mouth 3 (three) times daily. 08/19/17   Davonna Belling, MD  aspirin EC 81 MG tablet Take 162 mg by mouth daily.    [provider]  benzonatate (TESSALON) 100 MG capsule Take 1 capsule by mouth 3 (three) times daily as needed. 08/16/17   [provider]  Cholecalciferol (VITAMIN D) 2000 units CAPS Take by mouth daily.    [provider]  Cyanocobalamin (VITAMIN B 12) 100 MCG LOZG Take 500 mg by mouth daily.    [provider]  furosemide (LASIX) 20 MG tablet Take 20 mg by mouth.    [provider]  glimepiride (AMARYL) 2 MG tablet Take 0.5 tablets by mouth daily. 08/12/17   [provider]  Insulin Detemir (LEVEMIR FLEXTOUCH) 100 UNIT/ML Pen Inject  15-20 Units into the skin 2 (two) times daily.     [provider]  levothyroxine (SYNTHROID, LEVOTHROID) 125 MCG tablet Take 0.5 tablets by mouth daily before breakfast.  07/18/17   [provider]  losartan (COZAAR) 100 MG tablet Take 100 mg by mouth daily.    [provider]  metFORMIN (GLUCOPHAGE) 1000 MG tablet Take 1,000 mg by mouth 2 (two) times daily with a meal.    [provider]  metoprolol succinate (TOPROL-XL) 50 MG 24 hr tablet Take 50 mg by mouth daily. Take with or immediately following a meal.    [provider]  Multiple Vitamin (MULTIVITAMIN) capsule Take 1 capsule by mouth daily.    [provider]  pravastatin (PRAVACHOL) 10 MG tablet Take 10 mg by mouth every other day.     [provider]  vitamin C (ASCORBIC ACID) 500 MG tablet Take 500 mg by mouth daily.    [provider]    Allergies    Patient has no known allergies.  Review of  Systems   Review of Systems  Constitutional: Negative for chills and fever.  Respiratory: Negative for shortness of breath.   Cardiovascular: Negative for chest pain.  Gastrointestinal: Negative for nausea and vomiting.  Musculoskeletal: Positive for arthralgias and joint swelling.  Skin: Negative for rash and wound.  Neurological: Negative for weakness and numbness.  All other systems reviewed and are negative.   Physical Exam Updated Vital Signs BP (!) 147/114 (BP Location: Right Arm)   Pulse 82   Temp 98.6 F (37 C) (Oral)   Resp 16   Ht 5\' 3"  (1.6 m)   Wt 86.2 kg   SpO2 97%   BMI 33.66 kg/m   Physical Exam Vitals and nursing note reviewed.  Constitutional:      General: She is not in acute distress.    Appearance: Normal appearance. She is not ill-appearing or toxic-appearing.  HENT:     Head: Normocephalic and atraumatic.  Neck:     Comments: No midline tenderness.  Cardiovascular:     Rate and Rhythm: Normal rate.     Pulses:          Radial pulses are 2+ on the right side and 2+ on the left side.  Pulmonary:     Effort: No respiratory distress.     Breath sounds: Normal breath sounds.  Musculoskeletal:     Cervical back: Normal range of motion and neck supple.     Comments: Upper extremities: Patient has swelling with mild bruising  to the dorsal aspect of the radial aspect of the hand including the 1st metacarpal & anatomical snuff box region, there is also swelling to the left thenar eminence. The swollen area is tender to palpation more so dorsally. No obvious wounds. Not hot to touch or erythematous. No streaking. No palpable fluctuance or gross abscess. Intact AROM throughout with mild limitation in L thumb extension- is able to do somewhat, intact painless passive range of motion throughout.   Skin:    General: Skin is warm and dry.     Capillary Refill: Capillary refill takes less than 2 seconds.  Neurological:     Mental Status: She is alert.      Comments: Alert. Clear speech. Sensation grossly intact to bilateral upper extremities. 5/5 symmetric grip strength. Ambulatory.   Psychiatric:        Mood and Affect: Mood normal.        Behavior: Behavior normal.  ED Results / Procedures / Treatments   Labs (all labs ordered are listed, but only abnormal results are displayed) Labs Reviewed  BASIC METABOLIC PANEL - Abnormal; Notable for the following components:      Result Value   Glucose, Bld 156 (*)    All other components within normal limits  CBC WITH DIFFERENTIAL/PLATELET - Abnormal; Notable for the following components:   Neutro Abs 8.5 (*)    All other components within normal limits    EKG None  Radiology DG Hand Complete Left  Result Date: 06/25/2019 CLINICAL DATA:  Swelling and pain.  No known injury. EXAM: LEFT HAND - COMPLETE 3+ VIEW COMPARISON:  No prior. FINDINGS: Diffuse soft tissue swelling cannot be excluded. Tiny radiopaque foreign body noted the soft tissues of the thumb. Diffuse osteopenia and severe degenerative change. No acute bony or joint abnormality. No evidence of fracture or dislocation. IMPRESSION: 1. Diffuse soft tissue swelling cannot be excluded. Tiny radiopaque foreign body noted in the soft tissues of the thumb. 2. Diffuse osteopenia and severe degenerative change. No acute abnormality identified. Electronically Signed   By: Maisie Fushomas  Register   On: 06/25/2019 13:32    Procedures Procedures (including critical care time)  Medications Ordered in ED Medications - No data to display  ED Course  I have reviewed the triage vital signs and the nursing notes.  Pertinent labs & imaging results that were available during my care of the patient were reviewed by me and considered in my medical decision making (see chart for details).    MDM Rules/Calculators/A&P                      Patient presents to the ED with atraumatic L hand pain/swelling. Nontoxic appearing, vitals WNL with  the exception of mildly elevated BP- doubt HTN emergency. Exam w/o significant wounds/erythema/warmth, afebrile, no significant leukocytosis- does not seem consistent with infectious process at this time such as cellulitis, no fluctuance to indicate abscess, good ROM not consistent with septic joint/flexor tenosynovitis. Xray w/o fx/dislocation. There is an FB to the thumb in the area of the IP joint but this is not location of patient's sxs and this area was re-examined & unremarkable without wounds. NVI distally. Will trial ace wrap with PRICE & very short course of NSAIDs as her renal function is WNL today, and 2 day recheck w/ return precautions. I discussed results, treatment plan, need for follow-up, and return precautions with the patient. Provided opportunity for questions, patient confirmed understanding and is in agreement with plan.   This is a shared visit with supervising physician Dr. Jacqulyn BathLong who has independently evaluated patient & provided guidance in evaluation/management/disposition, in agreement with care    Final Clinical Impression(s) / ED Diagnoses Final diagnoses:  Pain of left hand  Swelling of left hand    Rx / DC Orders ED Discharge Orders         Ordered    ibuprofen (ADVIL) 400 MG tablet  Every 8 hours PRN,   Status:  Discontinued     06/25/19 1507    ibuprofen (ADVIL) 400 MG tablet  Every 8 hours PRN     06/25/19 1515           , [place] LibertySamantha R, PA-C 06/25/19 1516    Maia PlanLong, Joshua G, MD 06/27/19 1350

## 2019-06-25 NOTE — ED Triage Notes (Signed)
Pt's left hand started swelling throughout the night. States she is unsure of what happened. Noted swelling. NAD

## 2019-06-25 NOTE — Discharge Instructions (Addendum)
Please read and follow all provided instructions.  You have been seen today for left hand pain and swelling, your xray does not show a fracture/dislocation, it does show arthritis with degeneration, there is a small foreign body in your thumb as well but we do not feel this is related. We have placed you in an ace wrap to help with the swelling. We would like you to follow below instructions.   Home care instructions: -- *PRICE in the first 24-48 hours: Protect with ace wrap Rest Ice- Do not apply ice pack directly to your skin, place towel or similar between your skin and ice/ice pack. Apply ice for 20 min, then remove for 40 min while awake Compression- Wear ace wrap Elevate affected extremity above the level of your heart when not walking around for the first 24-48 hours   Medications:  Ibuprofen- this is a nonsteroidal anti-inflammatory medication that will help with pain and swelling. Be sure to take this medication as prescribed with food, 1 pill every 8 hours,  It should be taken with food, as it can cause stomach upset, and more seriously, stomach bleeding. Do not take other nonsteroidal anti-inflammatory medications with this such as naproxen, Advil, Motrin, Aleve, Mobic, Goodie Powder, or Motrin.    You make take Tylenol per over the counter dosing with these medications.   We have prescribed you new medication(s) today. Discuss the medications prescribed today with your pharmacist as they can have adverse effects and interactions with your other medicines including over the counter and prescribed medications. Seek medical evaluation if you start to experience new or abnormal symptoms after taking one of these medicines, seek care immediately if you start to experience difficulty breathing, feeling of your throat closing, facial swelling, or rash as these could be indications of a more serious allergic reaction  Follow-up instructions: Please follow-up with your primary care provider or  return to the ER within 48 hours for a recheck of the area. Return instructions:  Please return if your digits or extremity are numb or tingling, appear gray or blue, or you have severe pain (also elevate the extremity and loosen splint or wrap if you were given one) Please return if you have redness or fevers.  Please return of you have worsening pain or swelling.  Please return to the Emergency Department if you experience worsening symptoms.  Please return if you have any other emergent concerns. Additional Information:  Your vital signs today were: BP (!) 147/114 (BP Location: Right Arm)   Pulse 82   Temp 98.6 F (37 C) (Oral)   Resp 16   Ht 5\' 3"  (1.6 m)   Wt 86.2 kg   SpO2 97%   BMI 33.66 kg/m  If your blood pressure (BP) was elevated above 135/85 this visit, please have this repeated by your doctor within one month. ---------------

## 2020-04-21 ENCOUNTER — Ambulatory Visit
Admission: EM | Admit: 2020-04-21 | Discharge: 2020-04-21 | Disposition: A | Discharge status: Home / Self Care | Payer: Medicare Other

## 2020-04-21 ENCOUNTER — Encounter (HOSPITAL_COMMUNITY): Payer: Self-pay | Admitting: *Deleted

## 2020-04-21 ENCOUNTER — Other Ambulatory Visit: Payer: Self-pay

## 2020-04-21 ENCOUNTER — Emergency Department (HOSPITAL_COMMUNITY)
Admission: EM | Admit: 2020-04-21 | Discharge: 2020-04-21 | Disposition: A | Discharge status: Home / Self Care | Payer: Medicare Other | Attending: Emergency Medicine | Admitting: Emergency Medicine

## 2020-04-21 DIAGNOSIS — M79605 Pain in left leg: Secondary | ICD-10-CM | POA: Insufficient documentation

## 2020-04-21 DIAGNOSIS — Z5321 Procedure and treatment not carried out due to patient leaving prior to being seen by health care provider: Secondary | ICD-10-CM | POA: Insufficient documentation

## 2020-04-21 DIAGNOSIS — M79604 Pain in right leg: Secondary | ICD-10-CM | POA: Diagnosis present

## 2020-04-21 LAB — BASIC METABOLIC PANEL
Anion gap: 10 (ref 5–15)
BUN: 18 mg/dL (ref 8–23)
CO2: 28 mmol/L (ref 22–32)
Calcium: 8.9 mg/dL (ref 8.9–10.3)
Chloride: 101 mmol/L (ref 98–111)
Creatinine, Ser: 0.73 mg/dL (ref 0.44–1.00)
GFR, Estimated: >60 mL/min (ref >60)
Glucose, Bld: 76 mg/dL (ref 70–99)
Potassium: 3.8 mmol/L (ref 3.5–5.1)
Sodium: 139 mmol/L (ref 135–145)

## 2020-04-21 LAB — CBC WITH DIFFERENTIAL/PLATELET
Abs Immature Granulocytes: 0.03 10*3/uL (ref 0.00–0.07)
Basophils Absolute: 0.1 10*3/uL (ref 0.0–0.1)
Basophils Relative: 1 %
Eosinophils Absolute: 0.3 10*3/uL (ref 0.0–0.5)
Eosinophils Relative: 3 %
HCT: 37.2 % (ref 36.0–46.0)
Hemoglobin: 12 g/dL (ref 12.0–15.0)
Immature Granulocytes: 0 %
Lymphocytes Relative: 18 %
Lymphs Abs: 1.4 10*3/uL (ref 0.7–4.0)
MCH: 30.9 pg (ref 26.0–34.0)
MCHC: 32.3 g/dL (ref 30.0–36.0)
MCV: 95.9 fL (ref 80.0–100.0)
Monocytes Absolute: 0.7 10*3/uL (ref 0.1–1.0)
Monocytes Relative: 10 %
Neutro Abs: 5.2 10*3/uL (ref 1.7–7.7)
Neutrophils Relative %: 68 %
Platelets: 378 10*3/uL (ref 150–400)
RBC: 3.88 MIL/uL (ref 3.87–5.11)
RDW: 13.2 % (ref 11.5–15.5)
WBC: 7.6 10*3/uL (ref 4.0–10.5)
nRBC: 0 % (ref 0.0–0.2)

## 2020-04-21 NOTE — ED Triage Notes (Signed)
Pt presents with bilateral leg edema, left is larger than right with redness extending into thigh, pt states she completed antibiotic last week with little improvement . Provider made aware and due to severity of swelling, pt advised to go to ED for higher level of care

## 2020-04-21 NOTE — ED Triage Notes (Signed)
Pt with bilateral leg pain with redness and swelling.  Pt seen at Urgent Care and sent here.

## 2020-05-09 ENCOUNTER — Emergency Department (HOSPITAL_COMMUNITY)
Admission: EM | Admit: 2020-05-09 | Discharge: 2020-05-09 | Disposition: A | Discharge status: Home / Self Care | Payer: Medicare Other | Attending: Emergency Medicine | Admitting: Emergency Medicine

## 2020-05-09 ENCOUNTER — Encounter (HOSPITAL_COMMUNITY): Payer: Self-pay | Admitting: *Deleted

## 2020-05-09 ENCOUNTER — Emergency Department (HOSPITAL_COMMUNITY): Payer: Medicare Other

## 2020-05-09 DIAGNOSIS — Z794 Long term (current) use of insulin: Secondary | ICD-10-CM | POA: Diagnosis not present

## 2020-05-09 DIAGNOSIS — E039 Hypothyroidism, unspecified: Secondary | ICD-10-CM | POA: Diagnosis not present

## 2020-05-09 DIAGNOSIS — Z7982 Long term (current) use of aspirin: Secondary | ICD-10-CM | POA: Insufficient documentation

## 2020-05-09 DIAGNOSIS — Z7984 Long term (current) use of oral hypoglycemic drugs: Secondary | ICD-10-CM | POA: Diagnosis not present

## 2020-05-09 DIAGNOSIS — I10 Essential (primary) hypertension: Secondary | ICD-10-CM | POA: Diagnosis not present

## 2020-05-09 DIAGNOSIS — S8001XA Contusion of right knee, initial encounter: Secondary | ICD-10-CM | POA: Insufficient documentation

## 2020-05-09 DIAGNOSIS — E119 Type 2 diabetes mellitus without complications: Secondary | ICD-10-CM | POA: Diagnosis not present

## 2020-05-09 DIAGNOSIS — S80911A Unspecified superficial injury of right knee, initial encounter: Secondary | ICD-10-CM | POA: Diagnosis present

## 2020-05-09 DIAGNOSIS — Z79899 Other long term (current) drug therapy: Secondary | ICD-10-CM | POA: Diagnosis not present

## 2020-05-09 DIAGNOSIS — W19XXXA Unspecified fall, initial encounter: Secondary | ICD-10-CM | POA: Insufficient documentation

## 2020-05-09 LAB — COMPREHENSIVE METABOLIC PANEL
ALT: 22 U/L (ref 0–44)
AST: 25 U/L (ref 15–41)
Albumin: 3.8 g/dL (ref 3.5–5.0)
Alkaline Phosphatase: 102 U/L (ref 38–126)
Anion gap: 10 (ref 5–15)
BUN: 12 mg/dL (ref 8–23)
CO2: 26 mmol/L (ref 22–32)
Calcium: 8.9 mg/dL (ref 8.9–10.3)
Chloride: 103 mmol/L (ref 98–111)
Creatinine, Ser: 0.72 mg/dL (ref 0.44–1.00)
GFR, Estimated: >60 mL/min (ref >60)
Glucose, Bld: 114 mg/dL — ABNORMAL HIGH (ref 70–99)
Potassium: 3.4 mmol/L — ABNORMAL LOW (ref 3.5–5.1)
Sodium: 139 mmol/L (ref 135–145)
Total Bilirubin: 0.5 mg/dL (ref 0.3–1.2)
Total Protein: 8.8 g/dL — ABNORMAL HIGH (ref 6.5–8.1)

## 2020-05-09 LAB — CBC WITH DIFFERENTIAL/PLATELET
Abs Immature Granulocytes: 0.03 10*3/uL (ref 0.00–0.07)
Basophils Absolute: 0.1 10*3/uL (ref 0.0–0.1)
Basophils Relative: 1 %
Eosinophils Absolute: 0.2 10*3/uL (ref 0.0–0.5)
Eosinophils Relative: 2 %
HCT: 38 % (ref 36.0–46.0)
Hemoglobin: 12.4 g/dL (ref 12.0–15.0)
Immature Granulocytes: 0 %
Lymphocytes Relative: 14 %
Lymphs Abs: 1.1 10*3/uL (ref 0.7–4.0)
MCH: 31 pg (ref 26.0–34.0)
MCHC: 32.6 g/dL (ref 30.0–36.0)
MCV: 95 fL (ref 80.0–100.0)
Monocytes Absolute: 0.7 10*3/uL (ref 0.1–1.0)
Monocytes Relative: 8 %
Neutro Abs: 5.9 10*3/uL (ref 1.7–7.7)
Neutrophils Relative %: 75 %
Platelets: 343 10*3/uL (ref 150–400)
RBC: 4 MIL/uL (ref 3.87–5.11)
RDW: 13.4 % (ref 11.5–15.5)
WBC: 7.9 10*3/uL (ref 4.0–10.5)
nRBC: 0 % (ref 0.0–0.2)

## 2020-05-09 NOTE — Discharge Instructions (Addendum)
Increase your Lasix so you are taking 2 pills a day.  Keep your legs elevated.  Take Tylenol for any pain in your knee from your fall.  Make sure you see your family doctor next week.  We will give you a copy of the labs to take to your doctor

## 2020-05-09 NOTE — ED Provider Notes (Addendum)
[facility] EMERGENCY DEPARTMENT Provider Note   CSN: [id number] Arrival date & time: 05/09/20  1357     History Chief Complaint  Patient presents with  . Leg Pain    Amber Dudley is a 86 y.o. female.  Patient complains of swelling to her legs it has been there for years and also she fell and hurt her knee.  The history is provided by the patient and medical records. No language interpreter was used.  Leg Pain Lower extremity pain location: Right knee. Injury: yes   Mechanism of injury: fall   Fall:    Fall occurred:  Standing   Impact surface:  Carpet   Entrapped after fall: no   Pain details:    Quality:  Aching   Radiates to:  Does not radiate   Severity:  Moderate Associated symptoms: no back pain and no fatigue        Past Medical History:  Diagnosis Date  . Diabetes mellitus, type II (HCC)   . Hyperlipidemia   . Hypertension   . Hypothyroidism     Patient Active Problem List   Diagnosis Date Noted  . Uncontrolled type 2 diabetes mellitus without complication, with long-term current use of insulin 07/01/2015  . Essential hypertension, benign 07/01/2015  . Hyperlipidemia 07/01/2015    Past Surgical History:  Procedure Laterality Date  . CATARACT EXTRACTION    . OTHER SURGICAL HISTORY    . OTHER SURGICAL HISTORY     L hip  . OTHER SURGICAL HISTORY     L Arm fx  . TONSILLECTOMY       OB History    Gravida  3   Para  3   Term  3   Preterm      AB      Living  3     SAB      TAB      Ectopic      Multiple      Live Births              Family History  Problem Relation Age of Onset  . Heart failure Mother   . Heart disease Father   . Stroke Father   . Diabetes Sister   . Hypertension Sister   . Heart failure Sister   . Cancer Sister     Social History   Tobacco Use  . Smoking status: Never Smoker  . Smokeless tobacco: Never Used  Substance Use Topics  . Alcohol use: No    Alcohol/week: 0.0 standard drinks  .  Drug use: No    Home Medications Prior to Admission medications   Medication Sig Start Date End Date Taking? Authorizing Provider  amLODipine (NORVASC) 5 MG tablet Take 5 mg by mouth daily. 04/06/20  Yes [provider]  aspirin EC 81 MG tablet Take 162 mg by mouth daily.   Yes [provider]  Cholecalciferol (VITAMIN D) 2000 units CAPS Take by mouth daily.   Yes [provider]  Cyanocobalamin (VITAMIN B 12) 100 MCG LOZG Take 500 mg by mouth daily.   Yes [provider]  furosemide (LASIX) 20 MG tablet Take 20 mg by mouth.   Yes [provider]  glimepiride (AMARYL) 2 MG tablet Take 0.5 tablets by mouth daily. 08/12/17  Yes [provider]  ibuprofen (ADVIL) 400 MG tablet Take 1 tablet (400 mg total) by mouth every 8 (eight) hours as needed. Patient taking differently: Take 800 mg by mouth  every 8 (eight) hours as needed.  06/25/19  Yes Petrucelli, Samantha R, PA-C  Insulin Detemir (LEVEMIR FLEXTOUCH) 100 UNIT/ML Pen Inject 15-20 Units into the skin See admin instructions. Inject 20 units in the morning and 15 units every evening for diabetes   Yes [provider]  levothyroxine (SYNTHROID, LEVOTHROID) 125 MCG tablet Take 0.5 tablets by mouth daily before breakfast.  07/18/17  Yes [provider]  losartan (COZAAR) 100 MG tablet Take 100 mg by mouth daily.   Yes [provider]  metFORMIN (GLUCOPHAGE) 1000 MG tablet Take 1,000 mg by mouth 2 (two) times daily with a meal.   Yes [provider]  metoprolol succinate (TOPROL-XL) 50 MG 24 hr tablet Take 50 mg by mouth daily. Take with or immediately following a meal.   Yes [provider]  Multiple Vitamin (MULTIVITAMIN) capsule Take 1 capsule by mouth daily.   Yes [provider]  pravastatin (PRAVACHOL) 10 MG tablet Take 10 mg by mouth every other day.    Yes [provider]  vitamin C (ASCORBIC ACID) 500 MG tablet Take 500 mg by  mouth daily.   Yes [provider]  amoxicillin (AMOXIL) 500 MG capsule Take 1 capsule (500 mg total) by mouth 3 (three) times daily. Patient not taking: Reported on 05/09/2020 08/19/17   Benjiman Core, MD  benzonatate (TESSALON) 100 MG capsule Take 1 capsule by mouth 3 (three) times daily as needed. Patient not taking: Reported on 05/09/2020 08/16/17   [provider]  ciprofloxacin (CIPRO) 250 MG tablet Take 250 mg by mouth 2 (two) times daily. Patient not taking: Reported on 05/09/2020 03/27/20   [provider]    Allergies    Patient has no known allergies.  Review of Systems   Review of Systems  Constitutional: Negative for appetite change and fatigue.  HENT: Negative for congestion, ear discharge and sinus pressure.   Eyes: Negative for discharge.  Respiratory: Negative for cough.   Cardiovascular: Negative for chest pain.  Gastrointestinal: Negative for abdominal pain and diarrhea.  Genitourinary: Negative for frequency and hematuria.  Musculoskeletal: Negative for back pain.       Right knee pain  Skin: Negative for rash.  Neurological: Negative for seizures and headaches.  Psychiatric/Behavioral: Negative for hallucinations.    Physical Exam Updated Vital Signs BP 133/71   Pulse 65   Temp 98.3 F (36.8 C)   Resp 17   SpO2 99%   Physical Exam Vitals reviewed.  Constitutional:      Appearance: She is well-developed.  HENT:     Head: Normocephalic.     Right Ear: Tympanic membrane normal.     Nose: Nose normal.  Eyes:     General: No scleral icterus.    Conjunctiva/sclera: Conjunctivae normal.  Neck:     Thyroid: No thyromegaly.  Cardiovascular:     Rate and Rhythm: Normal rate and regular rhythm.     Heart sounds: No murmur heard.  No friction rub. No gallop.   Pulmonary:     Breath sounds: No stridor. No wheezing or rales.  Chest:     Chest wall: No tenderness.  Abdominal:     General: There is no distension.      Tenderness: There is no abdominal tenderness. There is no rebound.  Musculoskeletal:        General: Normal range of motion.     Cervical back: Neck supple.     Comments: Tender right knee with 3+ edema in  both lower legs  Lymphadenopathy:     Cervical: No cervical adenopathy.  Skin:    Findings: No erythema or rash.  Neurological:     Mental Status: She is alert and oriented to person, place, and time.     Motor: No abnormal muscle tone.     Coordination: Coordination normal.  Psychiatric:        Behavior: Behavior normal.     ED Results / Procedures / Treatments   Labs (all labs ordered are listed, but only abnormal results are displayed) Labs Reviewed  COMPREHENSIVE METABOLIC PANEL - Abnormal; Notable for the following components:      Result Value   Potassium 3.4 (*)    Glucose, Bld 114 (*)    Total Protein 8.8 (*)    All other components within normal limits  CBC WITH DIFFERENTIAL/PLATELET    EKG None  Radiology DG Knee Complete 4 Views Right  Result Date: 05/09/2020 CLINICAL DATA:  Fall on right knee today with pain EXAM: RIGHT KNEE - COMPLETE 4+ VIEW COMPARISON:  None. FINDINGS: Diffuse right knee soft tissue swelling. Small suprapatellar right knee joint effusion. No fracture or dislocation. No suspicious focal osseous lesions. Moderate to severe tricompartmental right knee osteoarthritis, most prominent in the medial compartment. No radiopaque foreign bodies. IMPRESSION: 1. Diffuse right knee soft tissue swelling. Small suprapatellar right knee joint effusion. No fracture or dislocation. 2. Moderate to severe tricompartmental right knee osteoarthritis, most prominent in the medial compartment. Electronically Signed   By: Delbert Phenix M.D.   On: 05/09/2020 17:02    Procedures Procedures (including critical care time)  Medications Ordered in ED Medications - No data to display  ED Course  I have reviewed the triage vital signs and the nursing notes.  Pertinent  labs & imaging results that were available during my care of the patient were reviewed by me and considered in my medical decision making (see chart for details).    MDM Rules/Calculators/A&P                          Patient with significant peripheral edema along with contusion right knee.  She will follow up with her PCP and is told to double up on her Lasix      This patient presents to the ED for concern of knee pain, this involves an extensive number of treatment options, and is a complaint that carries with it a high risk of complications and morbidity.  The differential diagnosis includes contusion to knee   Lab Tests:   I Ordered, reviewed, and interpreted labs, which included CBC and chemistries which were unremarkable  Medicines ordered:     Imaging Studies ordered:   I ordered imaging studies which included right knee  I independently visualized and interpreted imaging which showed arthritis  Additional history obtained:   Additional history obtained from son  Previous records obtained and reviewed.  Consultations Obtained:     Reevaluation:  After the interventions stated above, I reevaluated the patient and found mild improvement  Critical Interventions:  .   Final Clinical Impression(s) / ED Diagnoses Final diagnoses:  Contusion of right knee, initial encounter    Rx / DC Orders ED Discharge Orders    None       Bethann Berkshire, MD 05/10/20 5027    Bethann Berkshire, MD 05/10/20 [phone]

## 2020-05-09 NOTE — ED Triage Notes (Signed)
Bilateral leg swelling for 2 weeks.

## 2020-05-09 NOTE — ED Notes (Signed)
Pain in right knee, fell today

## 2021-02-15 ENCOUNTER — Other Ambulatory Visit: Payer: Self-pay

## 2021-02-15 ENCOUNTER — Encounter: Payer: Self-pay | Admitting: Emergency Medicine

## 2021-02-15 ENCOUNTER — Ambulatory Visit
Admission: EM | Admit: 2021-02-15 | Discharge: 2021-02-15 | Disposition: A | Discharge status: Home / Self Care | Payer: Medicare Other | Attending: Family Medicine | Admitting: Family Medicine

## 2021-02-15 DIAGNOSIS — J0140 Acute pansinusitis, unspecified: Secondary | ICD-10-CM

## 2021-02-15 MED ORDER — SULFAMETHOXAZOLE-TRIMETHOPRIM 800-160 MG PO TABS: 1.0000 | ORAL_TABLET | Freq: Two times a day (BID) | ORAL | 0 refills | Status: AC

## 2021-02-15 MED ORDER — DEXAMETHASONE SODIUM PHOSPHATE 10 MG/ML IJ SOLN
10.0000 mg | Freq: Once | INTRAMUSCULAR | Status: AC
  Administered 2021-02-15: 10 mg via INTRAMUSCULAR

## 2021-02-15 NOTE — ED Provider Notes (Signed)
[facility]    CSN: [id number] Arrival date & time: 02/15/21  1412      History   Chief Complaint Chief Complaint  Patient presents with   Facial Pain    HPI Amber Dudley is a 87 y.o. female.   HPI Patient presents with nasal congestion, frontal head pressure and mild cough. Denies SOB. She has attempted relief with OTC medication without relief.  Past Medical History:  Diagnosis Date   Diabetes mellitus, type II (HCC)    Hyperlipidemia    Hypertension    Hypothyroidism     Patient Active Problem List   Diagnosis Date Noted   Uncontrolled type 2 diabetes mellitus without complication, with long-term current use of insulin 07/01/2015   Essential hypertension, benign 07/01/2015   Hyperlipidemia 07/01/2015    Past Surgical History:  Procedure Laterality Date   CATARACT EXTRACTION     OTHER SURGICAL HISTORY     OTHER SURGICAL HISTORY     L hip   OTHER SURGICAL HISTORY     L Arm fx   TONSILLECTOMY      OB History     Gravida  3   Para  3   Term  3   Preterm      AB      Living  3      SAB      IAB      Ectopic      Multiple      Live Births               Home Medications    Prior to Admission medications   Medication Sig Start Date End Date Taking? Authorizing Provider  sulfamethoxazole-trimethoprim (BACTRIM DS) 800-160 MG tablet Take 1 tablet by mouth 2 (two) times daily for 7 days. 02/15/21 02/22/21 Yes Bing Neighbors, FNP  amLODipine (NORVASC) 5 MG tablet Take 5 mg by mouth daily. 04/06/20   [provider]  aspirin EC 81 MG tablet Take 162 mg by mouth daily.    [provider]  Cholecalciferol (VITAMIN D) 2000 units CAPS Take by mouth daily.    [provider]  Cyanocobalamin (VITAMIN B 12) 100 MCG LOZG Take 500 mg by mouth daily.    [provider]  furosemide (LASIX) 20 MG tablet Take 20 mg by mouth.    [provider]  glimepiride (AMARYL) 2 MG tablet Take 0.5 tablets  by mouth daily. 08/12/17   [provider]  Insulin Detemir (LEVEMIR FLEXTOUCH) 100 UNIT/ML Pen Inject 15-20 Units into the skin See admin instructions. Inject 20 units in the morning and 15 units every evening for diabetes    [provider]  levothyroxine (SYNTHROID, LEVOTHROID) 125 MCG tablet Take 0.5 tablets by mouth daily before breakfast.  07/18/17   [provider]  losartan (COZAAR) 100 MG tablet Take 100 mg by mouth daily.    [provider]  metFORMIN (GLUCOPHAGE) 1000 MG tablet Take 1,000 mg by mouth 2 (two) times daily with a meal.    [provider]  metoprolol succinate (TOPROL-XL) 50 MG 24 hr tablet Take 50 mg by mouth daily. Take with or immediately following a meal.    [provider]  Multiple Vitamin (MULTIVITAMIN) capsule Take 1 capsule by mouth daily.    [provider]  pravastatin (PRAVACHOL) 10 MG tablet Take 10 mg by mouth every other day.     [provider]  vitamin C (ASCORBIC ACID) 500 MG  tablet Take 500 mg by mouth daily.    [provider]    Family History Family History  Problem Relation Age of Onset   Heart failure Mother    Heart disease Father    Stroke Father    Diabetes Sister    Hypertension Sister    Heart failure Sister    Cancer Sister     Social History Social History   Tobacco Use   Smoking status: Never   Smokeless tobacco: Never  Substance Use Topics   Alcohol use: No    Alcohol/week: 0.0 standard drinks   Drug use: No     Allergies   Patient has no known allergies.   Review of Systems Review of Systems Pertinent negatives listed in HPI  Physical Exam Triage Vital Signs ED Triage Vitals  Enc Vitals Group     BP 02/15/21 1659 (!) 182/97     Pulse Rate 02/15/21 1659 83     Resp 02/15/21 1659 20     Temp 02/15/21 1659 98.4 F (36.9 C)     Temp Source 02/15/21 1659 Oral     SpO2 02/15/21 1659 95 %     Weight --      Height --      Head  Circumference --      Peak Flow --      Pain Score 02/15/21 1700 0     Pain Loc --      Pain Edu? --      Excl. in GC? --    No data found.  Updated Vital Signs BP (!) 182/97   Pulse 83   Temp 98.4 F (36.9 C) (Oral)   Resp 20   SpO2 95%   Visual Acuity Right Eye Distance:   Left Eye Distance:   Bilateral Distance:    Right Eye Near:   Left Eye Near:    Bilateral Near:     Physical Exam  General Appearance:    Alert, cooperative, no distress  HENT:   Normocephalic, ears normal, nares mucosal edema with congestion, rhinorrhea, oropharynx  clear   Eyes:    PERRL, conjunctiva/corneas clear, EOM's intact       Lungs:     Clear to auscultation bilaterally, respirations unlabored  Heart:    Regular rate and rhythm  Neurologic:   Awake, alert, oriented x 3. No apparent focal neurological           deficit     UC Treatments / Results  Labs (all labs ordered are listed, but only abnormal results are displayed) Labs Reviewed - No data to display  EKG   Radiology No results found.  Procedures Procedures (including critical care time)  Medications Ordered in UC Medications  dexamethasone (DECADRON) injection 10 mg (10 mg Intramuscular Given 02/15/21 1724)    Initial Impression / Assessment and Plan / UC Course  I have reviewed the triage vital signs and the nursing notes.  Pertinent labs & imaging results that were available during my care of the patient were reviewed by me and considered in my medical decision making (see chart for details).    Acute sinusitis, treatment per discharge medication orders. Strict return precautions given if symptoms worsen or do not improve. Follow-up with PCP as needed Final diagnoses:  Acute non-recurrent pansinusitis   Discharge Instructions   None    ED Prescriptions     Medication Sig Dispense Auth. Provider   sulfamethoxazole-trimethoprim (BACTRIM DS) 800-160 MG tablet Take 1 tablet  by mouth 2 (two) times daily for 7  days. 14 tablet Bing Neighbors, FNP      PDMP not reviewed this encounter.   Bing Neighbors, FNP 02/21/21 2142

## 2021-02-15 NOTE — ED Triage Notes (Signed)
Pt presents today with c/o facial pain x 3 days. Denies fever.

## 2022-04-04 IMAGING — DX DG KNEE COMPLETE 4+V*R*
4 series · 4 of 4 positions shown · non-contrast
Comparison: None.

CLINICAL DATA: Fall on right knee today with pain

EXAM:
RIGHT KNEE - COMPLETE 4+ VIEW

[knee ap]
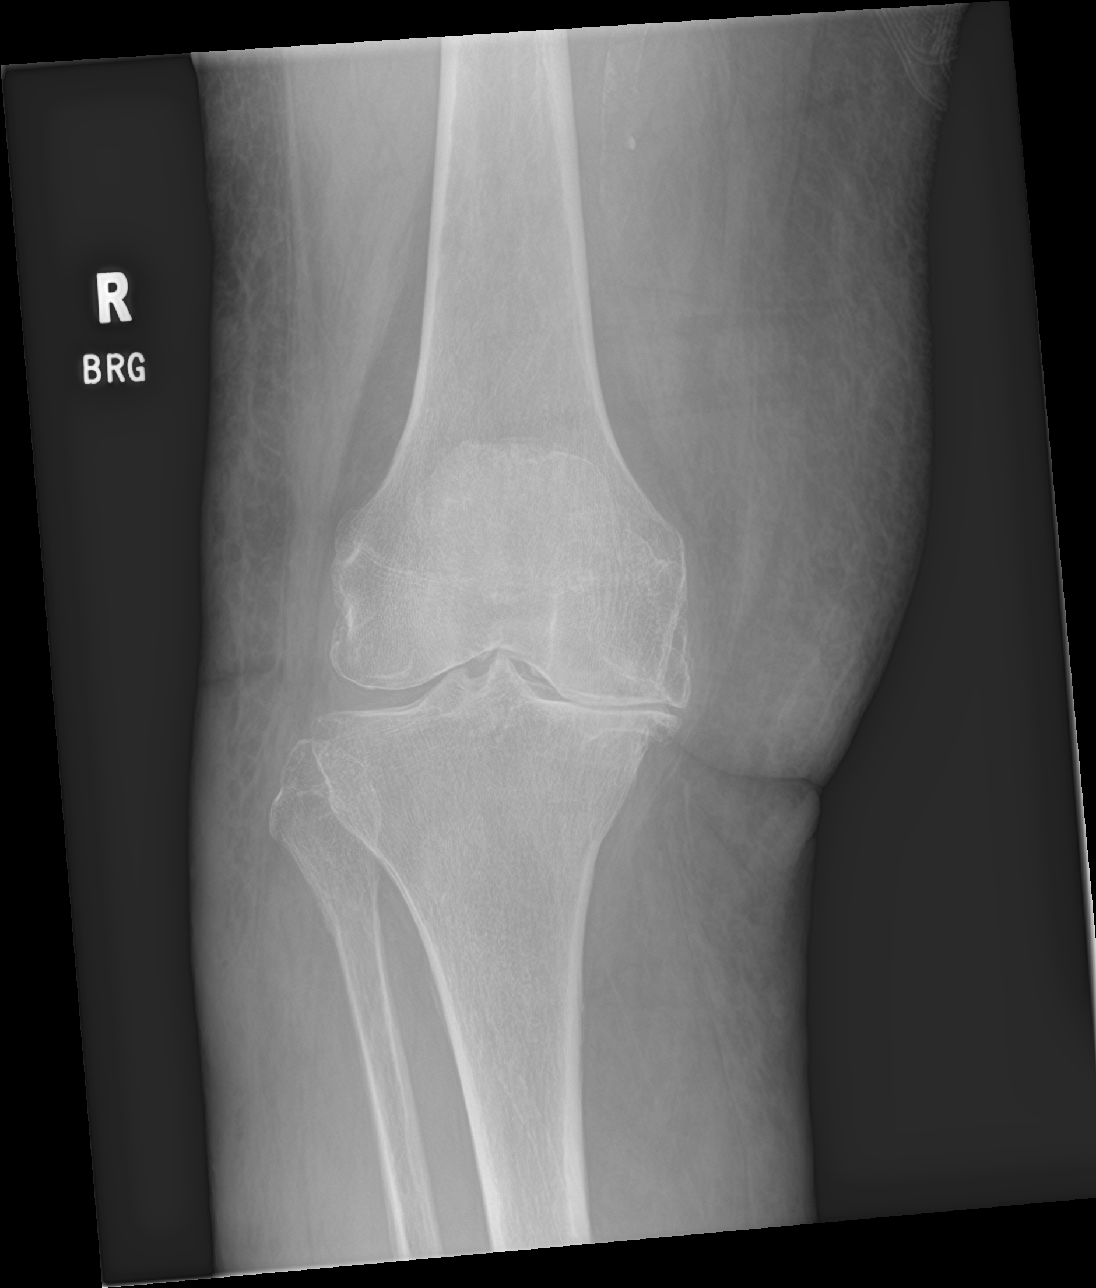

[knee obl (1 of 2)]
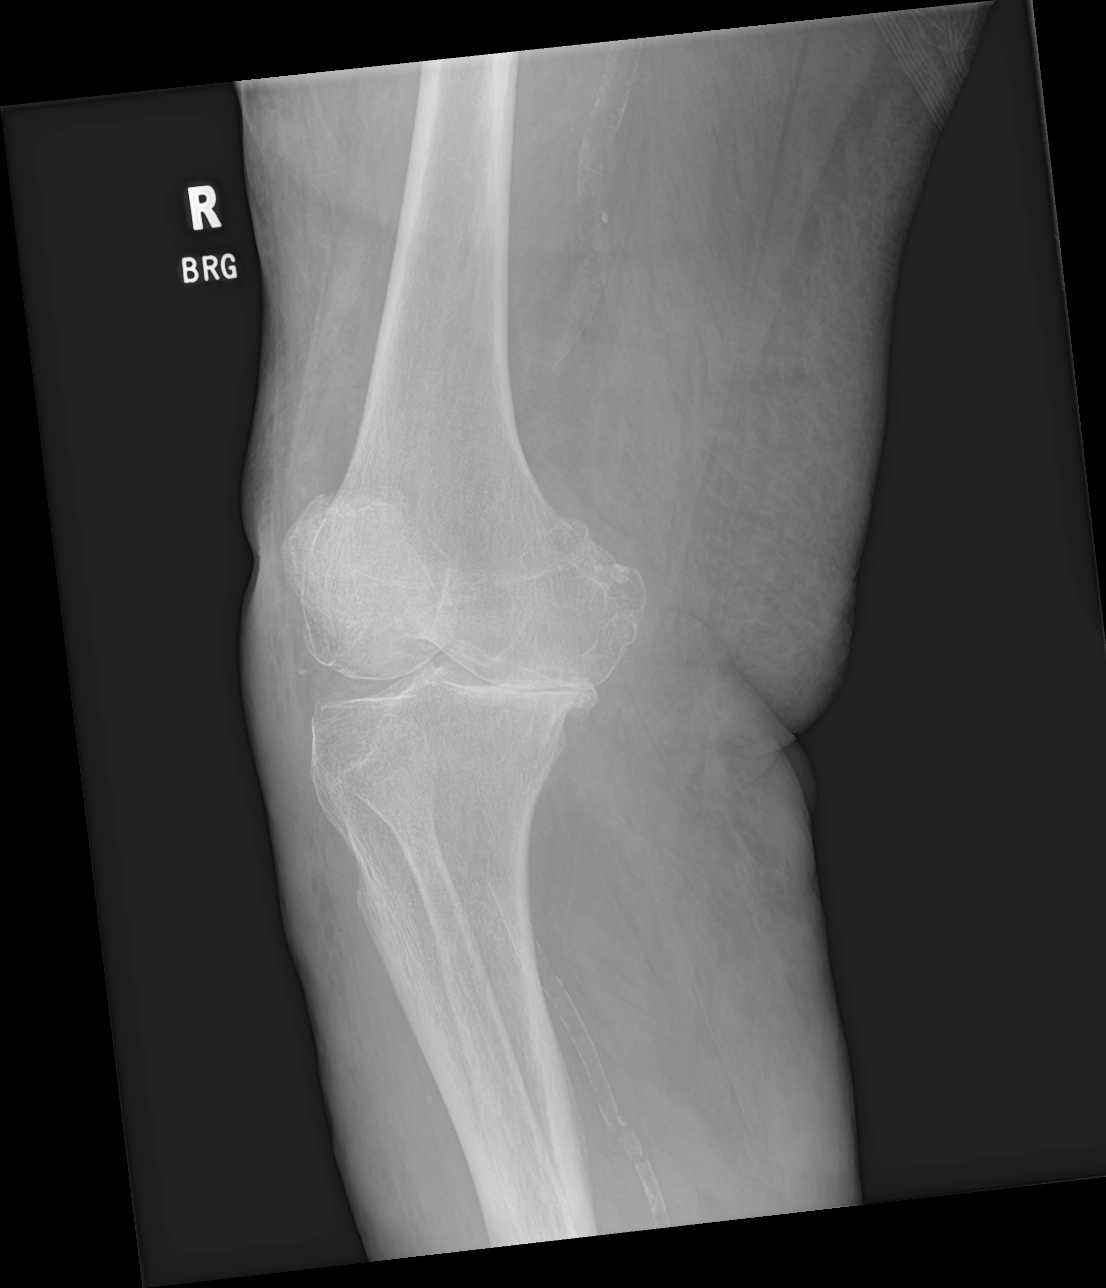

[knee obl (2 of 2)]
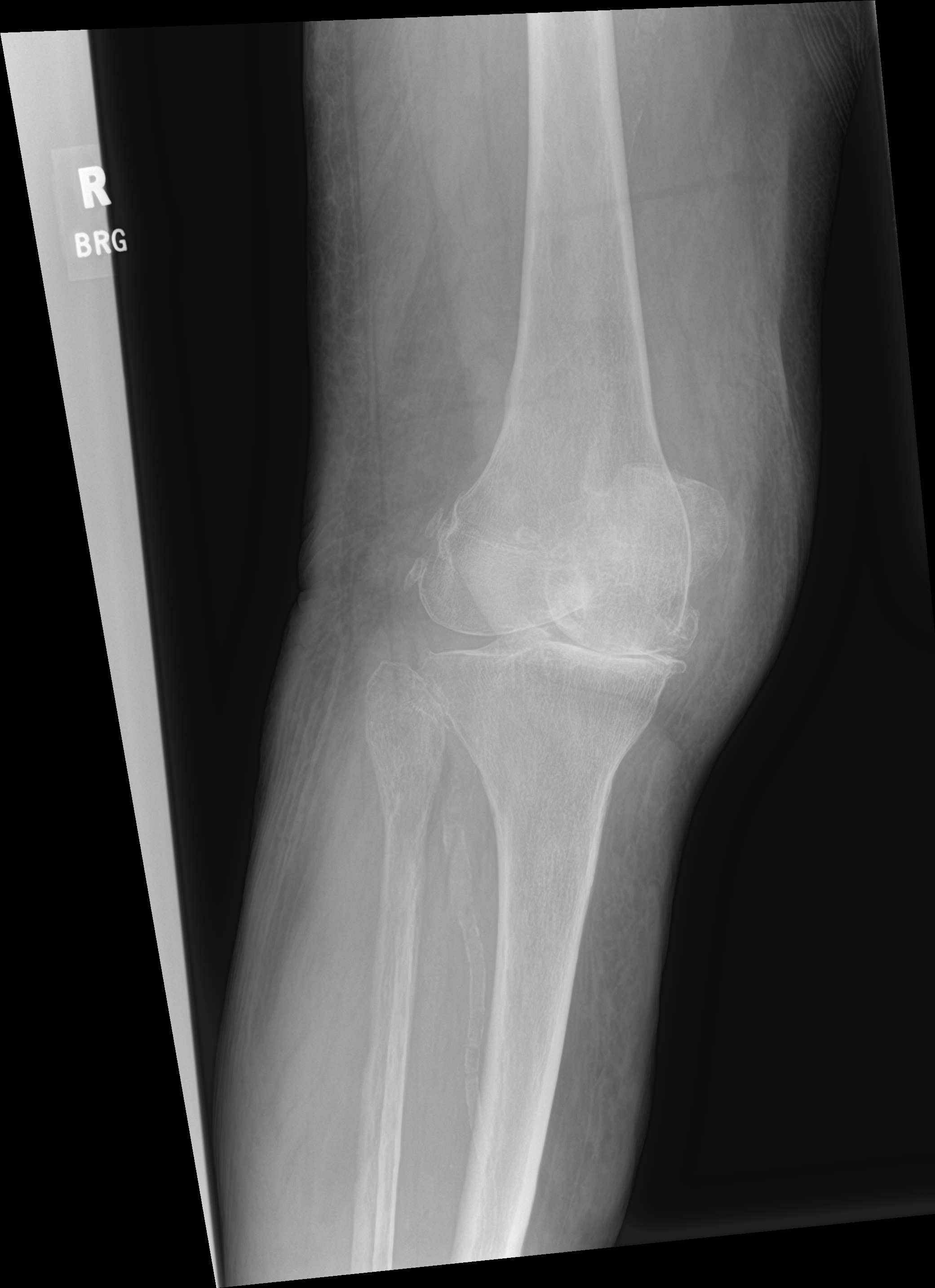

[knee lat]
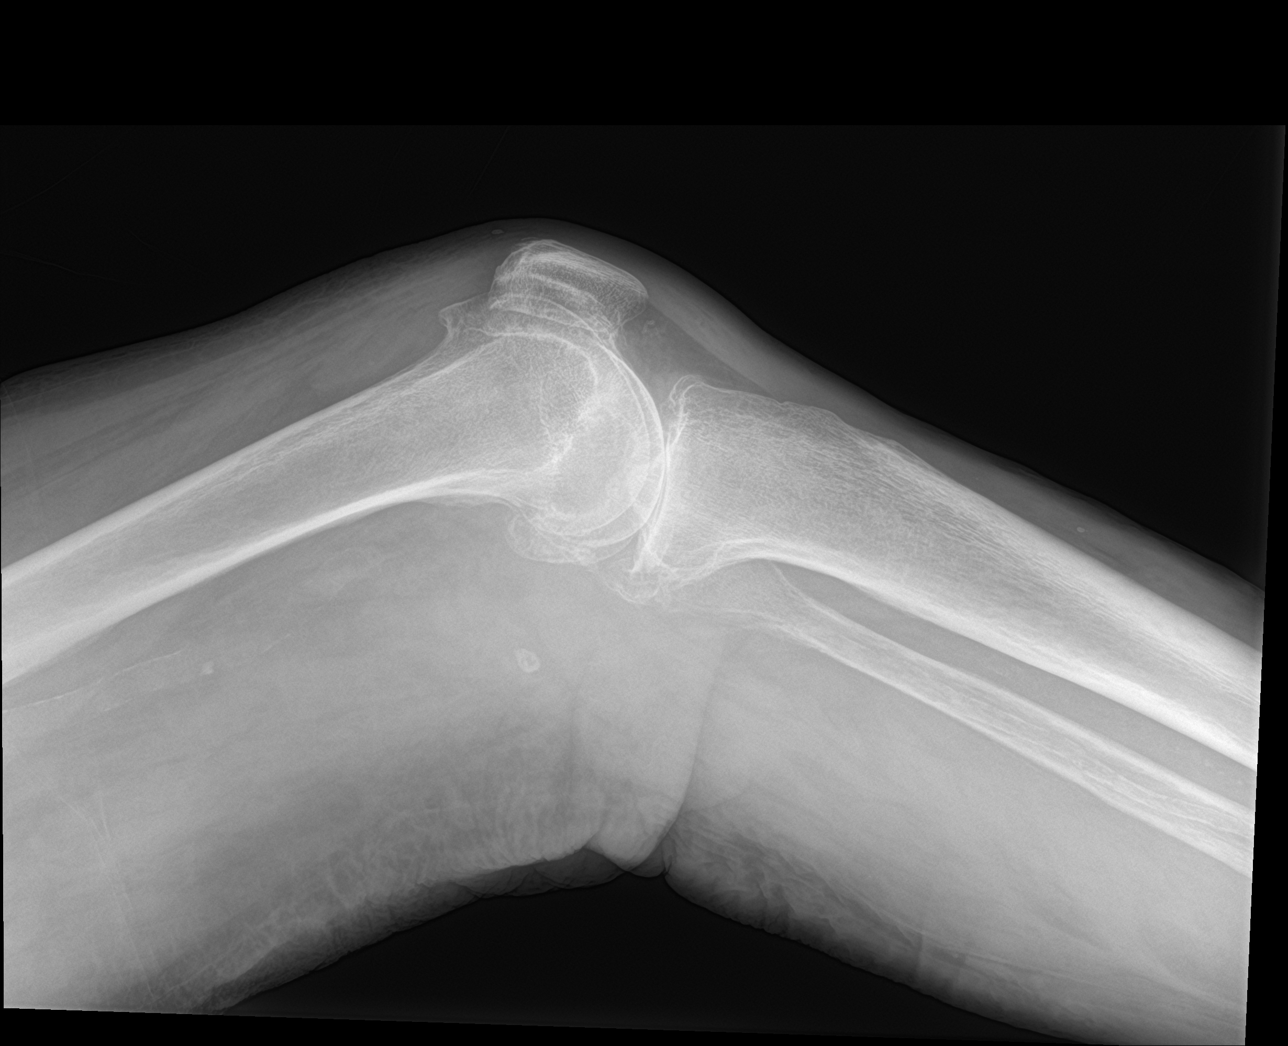

[4 of 4 positions shown; findings below may reference images not displayed]

FINDINGS: Diffuse right knee soft tissue swelling. Small suprapatellar right
knee joint effusion. No fracture or dislocation. No suspicious focal
osseous lesions. Moderate to severe tricompartmental right knee
osteoarthritis, most prominent in the medial compartment. No
radiopaque foreign bodies.
IMPRESSION: 1. Diffuse right knee soft tissue swelling. Small suprapatellar
right knee joint effusion. No fracture or dislocation.
2. Moderate to severe tricompartmental right knee osteoarthritis,
most prominent in the medial compartment.

## 2023-01-19 ENCOUNTER — Encounter (HOSPITAL_COMMUNITY): Payer: Self-pay

## 2023-01-19 ENCOUNTER — Other Ambulatory Visit: Payer: Self-pay

## 2023-01-19 ENCOUNTER — Inpatient Hospital Stay (HOSPITAL_COMMUNITY)
Admission: EM | Admit: 2023-01-19 | Discharge: 2023-01-31 | Disposition: A | Discharge status: Skilled Nursing Facility | Payer: Medicare Other | Attending: Internal Medicine | Admitting: Internal Medicine

## 2023-01-19 ENCOUNTER — Emergency Department (HOSPITAL_COMMUNITY): Payer: Medicare Other

## 2023-01-19 DIAGNOSIS — E11621 Type 2 diabetes mellitus with foot ulcer: Secondary | ICD-10-CM | POA: Diagnosis present

## 2023-01-19 DIAGNOSIS — E1152 Type 2 diabetes mellitus with diabetic peripheral angiopathy with gangrene: Secondary | ICD-10-CM | POA: Diagnosis not present

## 2023-01-19 DIAGNOSIS — Z5320 Procedure and treatment not carried out because of patient's decision for unspecified reasons: Secondary | ICD-10-CM | POA: Diagnosis present

## 2023-01-19 DIAGNOSIS — E43 Unspecified severe protein-calorie malnutrition: Secondary | ICD-10-CM | POA: Diagnosis present

## 2023-01-19 DIAGNOSIS — E785 Hyperlipidemia, unspecified: Secondary | ICD-10-CM | POA: Diagnosis present

## 2023-01-19 DIAGNOSIS — Z751 Person awaiting admission to adequate facility elsewhere: Secondary | ICD-10-CM

## 2023-01-19 DIAGNOSIS — L089 Local infection of the skin and subcutaneous tissue, unspecified: Secondary | ICD-10-CM | POA: Diagnosis present

## 2023-01-19 DIAGNOSIS — M1990 Unspecified osteoarthritis, unspecified site: Secondary | ICD-10-CM | POA: Diagnosis present

## 2023-01-19 DIAGNOSIS — Z79899 Other long term (current) drug therapy: Secondary | ICD-10-CM

## 2023-01-19 DIAGNOSIS — E1142 Type 2 diabetes mellitus with diabetic polyneuropathy: Secondary | ICD-10-CM | POA: Diagnosis present

## 2023-01-19 DIAGNOSIS — Z823 Family history of stroke: Secondary | ICD-10-CM

## 2023-01-19 DIAGNOSIS — L97419 Non-pressure chronic ulcer of right heel and midfoot with unspecified severity: Secondary | ICD-10-CM | POA: Diagnosis present

## 2023-01-19 DIAGNOSIS — L97519 Non-pressure chronic ulcer of other part of right foot with unspecified severity: Secondary | ICD-10-CM | POA: Diagnosis present

## 2023-01-19 DIAGNOSIS — Z66 Do not resuscitate: Secondary | ICD-10-CM | POA: Diagnosis not present

## 2023-01-19 DIAGNOSIS — Z79890 Hormone replacement therapy (postmenopausal): Secondary | ICD-10-CM

## 2023-01-19 DIAGNOSIS — E039 Hypothyroidism, unspecified: Secondary | ICD-10-CM | POA: Diagnosis present

## 2023-01-19 DIAGNOSIS — Z7189 Other specified counseling: Secondary | ICD-10-CM

## 2023-01-19 DIAGNOSIS — I878 Other specified disorders of veins: Secondary | ICD-10-CM | POA: Diagnosis present

## 2023-01-19 DIAGNOSIS — Z794 Long term (current) use of insulin: Secondary | ICD-10-CM

## 2023-01-19 DIAGNOSIS — I890 Lymphedema, not elsewhere classified: Secondary | ICD-10-CM | POA: Diagnosis present

## 2023-01-19 DIAGNOSIS — Z8249 Family history of ischemic heart disease and other diseases of the circulatory system: Secondary | ICD-10-CM

## 2023-01-19 DIAGNOSIS — I129 Hypertensive chronic kidney disease with stage 1 through stage 4 chronic kidney disease, or unspecified chronic kidney disease: Secondary | ICD-10-CM | POA: Diagnosis present

## 2023-01-19 DIAGNOSIS — Z7982 Long term (current) use of aspirin: Secondary | ICD-10-CM

## 2023-01-19 DIAGNOSIS — I440 Atrioventricular block, first degree: Secondary | ICD-10-CM | POA: Diagnosis present

## 2023-01-19 DIAGNOSIS — E1140 Type 2 diabetes mellitus with diabetic neuropathy, unspecified: Secondary | ICD-10-CM | POA: Diagnosis present

## 2023-01-19 DIAGNOSIS — E11622 Type 2 diabetes mellitus with other skin ulcer: Secondary | ICD-10-CM | POA: Diagnosis present

## 2023-01-19 DIAGNOSIS — N1831 Chronic kidney disease, stage 3a: Secondary | ICD-10-CM | POA: Diagnosis present

## 2023-01-19 DIAGNOSIS — E1165 Type 2 diabetes mellitus with hyperglycemia: Secondary | ICD-10-CM | POA: Diagnosis present

## 2023-01-19 DIAGNOSIS — Z6833 Body mass index (BMI) 33.0-33.9, adult: Secondary | ICD-10-CM

## 2023-01-19 DIAGNOSIS — Z808 Family history of malignant neoplasm of other organs or systems: Secondary | ICD-10-CM

## 2023-01-19 DIAGNOSIS — H3530 Unspecified macular degeneration: Secondary | ICD-10-CM | POA: Diagnosis present

## 2023-01-19 DIAGNOSIS — Z833 Family history of diabetes mellitus: Secondary | ICD-10-CM

## 2023-01-19 DIAGNOSIS — Z7984 Long term (current) use of oral hypoglycemic drugs: Secondary | ICD-10-CM

## 2023-01-19 DIAGNOSIS — E11628 Type 2 diabetes mellitus with other skin complications: Secondary | ICD-10-CM

## 2023-01-19 DIAGNOSIS — E1122 Type 2 diabetes mellitus with diabetic chronic kidney disease: Secondary | ICD-10-CM | POA: Diagnosis present

## 2023-01-19 LAB — MAGNESIUM: Magnesium: 1.8 mg/dL (ref 1.7–2.4)

## 2023-01-19 LAB — CBC WITH DIFFERENTIAL/PLATELET
Abs Immature Granulocytes: 0.05 10*3/uL (ref 0.00–0.07)
Basophils Absolute: 0.1 10*3/uL (ref 0.0–0.1)
Basophils Relative: 1 %
Eosinophils Absolute: 0.3 10*3/uL (ref 0.0–0.5)
Eosinophils Relative: 4 %
HCT: 31 % — ABNORMAL LOW (ref 36.0–46.0)
Hemoglobin: 9.7 g/dL — ABNORMAL LOW (ref 12.0–15.0)
Immature Granulocytes: 1 %
Lymphocytes Relative: 16 %
Lymphs Abs: 1.1 10*3/uL (ref 0.7–4.0)
MCH: 29.2 pg (ref 26.0–34.0)
MCHC: 31.3 g/dL (ref 30.0–36.0)
MCV: 93.4 fL (ref 80.0–100.0)
Monocytes Absolute: 0.7 10*3/uL (ref 0.1–1.0)
Monocytes Relative: 10 %
Neutro Abs: 4.8 10*3/uL (ref 1.7–7.7)
Neutrophils Relative %: 68 %
Platelets: 440 10*3/uL — ABNORMAL HIGH (ref 150–400)
RBC: 3.32 MIL/uL — ABNORMAL LOW (ref 3.87–5.11)
RDW: 14.7 % (ref 11.5–15.5)
WBC: 7 10*3/uL (ref 4.0–10.5)
nRBC: 0 % (ref 0.0–0.2)

## 2023-01-19 LAB — GLUCOSE, CAPILLARY
Glucose-Capillary: 158 mg/dL — ABNORMAL HIGH (ref 70–99)
Glucose-Capillary: 165 mg/dL — ABNORMAL HIGH (ref 70–99)

## 2023-01-19 LAB — BASIC METABOLIC PANEL
Anion gap: 8 (ref 5–15)
BUN: 11 mg/dL (ref 8–23)
CO2: 27 mmol/L (ref 22–32)
Calcium: 8.4 mg/dL — ABNORMAL LOW (ref 8.9–10.3)
Chloride: 103 mmol/L (ref 98–111)
Creatinine, Ser: 1.19 mg/dL — ABNORMAL HIGH (ref 0.44–1.00)
GFR, Estimated: 43 mL/min — ABNORMAL LOW (ref >60)
Glucose, Bld: 150 mg/dL — ABNORMAL HIGH (ref 70–99)
Potassium: 3.5 mmol/L (ref 3.5–5.1)
Sodium: 138 mmol/L (ref 135–145)

## 2023-01-19 LAB — LACTIC ACID, PLASMA
Lactic Acid, Venous: 1.8 mmol/L (ref 0.5–1.9)
Lactic Acid, Venous: 1.2 mmol/L (ref 0.5–1.9)

## 2023-01-19 LAB — HEMOGLOBIN A1C
Hgb A1c MFr Bld: 8.1 % — ABNORMAL HIGH (ref 4.8–5.6)
Mean Plasma Glucose: 185.77 mg/dL

## 2023-01-19 LAB — TSH: TSH: 8.051 u[IU]/mL — ABNORMAL HIGH (ref 0.350–4.500)

## 2023-01-19 MED ORDER — ALBUTEROL SULFATE (2.5 MG/3ML) 0.083% IN NEBU: 2.5000 mg | INHALATION_SOLUTION | RESPIRATORY_TRACT | Status: DC | PRN

## 2023-01-19 MED ORDER — VITAMIN B-12 1000 MCG PO TABS
500.0000 ug | ORAL_TABLET | Freq: Every day | ORAL | Status: DC
  Administered 2023-01-21 – 2023-01-31 (×11): 500 ug via ORAL
  Filled 2023-01-19 (×13): qty 1

## 2023-01-19 MED ORDER — SENNOSIDES-DOCUSATE SODIUM 8.6-50 MG PO TABS
1.0000 | ORAL_TABLET | Freq: Every evening | ORAL | Status: DC | PRN
  Administered 2023-01-25 – 2023-01-26 (×2): 1 via ORAL
  Filled 2023-01-19 (×2): qty 1

## 2023-01-19 MED ORDER — ACETAMINOPHEN 650 MG RE SUPP: 650.0000 mg | Freq: Four times a day (QID) | RECTAL | Status: DC | PRN

## 2023-01-19 MED ORDER — POTASSIUM CHLORIDE CRYS ER 20 MEQ PO TBCR
40.0000 meq | EXTENDED_RELEASE_TABLET | Freq: Once | ORAL | Status: AC
  Administered 2023-01-19: 40 meq via ORAL
  Filled 2023-01-19: qty 2

## 2023-01-19 MED ORDER — PIPERACILLIN-TAZOBACTAM 3.375 G IVPB 30 MIN
3.3750 g | Freq: Once | INTRAVENOUS | Status: AC
  Administered 2023-01-19: 3.375 g via INTRAVENOUS
  Filled 2023-01-19: qty 50

## 2023-01-19 MED ORDER — INSULIN ASPART 100 UNIT/ML IJ SOLN
0.0000 [IU] | Freq: Three times a day (TID) | INTRAMUSCULAR | Status: DC
  Administered 2023-01-20 (×2): 2 [IU] via SUBCUTANEOUS
  Administered 2023-01-21: 8 [IU] via SUBCUTANEOUS
  Administered 2023-01-21: 5 [IU] via SUBCUTANEOUS
  Administered 2023-01-21: 3 [IU] via SUBCUTANEOUS
  Administered 2023-01-22: 8 [IU] via SUBCUTANEOUS
  Administered 2023-01-22: 5 [IU] via SUBCUTANEOUS
  Administered 2023-01-22: 2 [IU] via SUBCUTANEOUS
  Administered 2023-01-23: 5 [IU] via SUBCUTANEOUS
  Administered 2023-01-23 (×2): 3 [IU] via SUBCUTANEOUS
  Administered 2023-01-24 – 2023-01-25 (×3): 2 [IU] via SUBCUTANEOUS
  Administered 2023-01-25: 5 [IU] via SUBCUTANEOUS
  Administered 2023-01-26: 2 [IU] via SUBCUTANEOUS
  Administered 2023-01-26: 3 [IU] via SUBCUTANEOUS
  Administered 2023-01-27: 2 [IU] via SUBCUTANEOUS
  Administered 2023-01-27 – 2023-01-29 (×4): 3 [IU] via SUBCUTANEOUS
  Administered 2023-01-29: 8 [IU] via SUBCUTANEOUS
  Administered 2023-01-30: 2 [IU] via SUBCUTANEOUS
  Administered 2023-01-30 – 2023-01-31 (×2): 3 [IU] via SUBCUTANEOUS

## 2023-01-19 MED ORDER — INSULIN ASPART 100 UNIT/ML IJ SOLN
0.0000 [IU] | Freq: Every day | INTRAMUSCULAR | Status: DC
  Administered 2023-01-23: 2 [IU] via SUBCUTANEOUS
  Administered 2023-01-25: 3 [IU] via SUBCUTANEOUS
  Administered 2023-01-27: 2 [IU] via SUBCUTANEOUS
  Administered 2023-01-30: 3 [IU] via SUBCUTANEOUS

## 2023-01-19 MED ORDER — AMLODIPINE BESYLATE 5 MG PO TABS
5.0000 mg | ORAL_TABLET | Freq: Every day | ORAL | Status: DC
  Administered 2023-01-19 – 2023-01-20 (×2): 5 mg via ORAL
  Filled 2023-01-19 (×2): qty 1

## 2023-01-19 MED ORDER — VANCOMYCIN HCL 2000 MG/400ML IV SOLN
2000.0000 mg | Freq: Once | INTRAVENOUS | Status: AC
  Administered 2023-01-19: 2000 mg via INTRAVENOUS
  Filled 2023-01-19: qty 400

## 2023-01-19 MED ORDER — ADULT MULTIVITAMIN W/MINERALS CH
1.0000 | ORAL_TABLET | Freq: Every day | ORAL | Status: DC
  Administered 2023-01-20 – 2023-01-31 (×12): 1 via ORAL
  Filled 2023-01-19 (×13): qty 1

## 2023-01-19 MED ORDER — VANCOMYCIN HCL 500 MG/100ML IV SOLN: 500.0000 mg | INTRAVENOUS | Status: DC

## 2023-01-19 MED ORDER — MULTIVITAMINS PO CAPS: 1.0000 | ORAL_CAPSULE | Freq: Every day | ORAL | Status: DC

## 2023-01-19 MED ORDER — METOPROLOL SUCCINATE ER 50 MG PO TB24
50.0000 mg | ORAL_TABLET | Freq: Every day | ORAL | Status: DC
  Administered 2023-01-19: 50 mg via ORAL
  Filled 2023-01-19 (×3): qty 1

## 2023-01-19 MED ORDER — ACETAMINOPHEN 325 MG PO TABS
650.0000 mg | ORAL_TABLET | Freq: Four times a day (QID) | ORAL | Status: DC | PRN
  Administered 2023-01-21: 650 mg via ORAL
  Filled 2023-01-19: qty 2

## 2023-01-19 MED ORDER — PIPERACILLIN-TAZOBACTAM 3.375 G IVPB
3.3750 g | Freq: Three times a day (TID) | INTRAVENOUS | Status: DC
  Administered 2023-01-20 – 2023-01-25 (×16): 3.375 g via INTRAVENOUS
  Filled 2023-01-19 (×16): qty 50

## 2023-01-19 MED ORDER — PRAVASTATIN SODIUM 10 MG PO TABS
10.0000 mg | ORAL_TABLET | ORAL | Status: DC
  Administered 2023-01-20: 10 mg via ORAL
  Filled 2023-01-19: qty 1

## 2023-01-19 NOTE — ED Notes (Signed)
Patient assisted to bathroom 

## 2023-01-19 NOTE — ED Triage Notes (Signed)
Pt states has had a wound on right heel for a month and pt hit the wound on something twice two days ago and the wound is worse and it kept bleeding. Pt has wound on entire right heel. The bedding of wound is necrotic. The wound has a foul smell. Pt's legs are erythematous. Pt has 4+ swelling of legs bilat.

## 2023-01-19 NOTE — Hospital Course (Addendum)
Amber Dudley's mum: Please read the note about the phone call with [name]. 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 Xray showed soft tissue ulceration overlying the plantar surface of the calcaneus and subcutaneous gas. MRI showed arge plantar heel ulceration with internal gas and hypo-enhancement measuring about 3.0 by 4.5 by 4.2 cm. Extends to involve the medial band of the plantar fascia with subtle reactive edema in the endosteal region of the left posteroinferior calcaneus. After obtaining ABIs which were indicative of PAD in her lower extremities, orthopedics recommended AKA of right lower extremity. Patient is refusing surgery, she has capacity. Vascular surgery recommends against arteriogram.  Blood cultures are NGTD, A1c 8.1%, no leukocytosis. Patient received 7 days of daptomycin and zosyn. Palliative care consulted. Patient decided to go to SNF with code status change to DNR, she does not wish to manage the infection with an AKA.    #Lower extremity chronic edema Patient has a long standing history of chronic LE edema. Obtained echocardiogram which demonstrated LVEF 60-65% with grade I diastolic dysfunction, and left atrial dilation, moderate. Most likely due to vascular disease and venous stasis.   #Hypothyroidism Patient reports being off her levothyroxine for a while. TSH was 8.051, began levothyroxine at 68.30mcg.   #AKI vs CKD Patient's Cr was elevated upon admission. Throughout her hospitalization, her serum creatinine remained elevated which suggests a degree of CKD  instead of AKI due to chronically elevated serum Creatinine while we are holding nephrotoxic agents and she has good oral intake.   #Hypertension Patient was on losartan at home but was held for a suspected AKI. During her hospitalization, we increased the toprol XL to 100mg  and amlodipine to 10mg . We added hydralazine 10mg  to limit hypertensive episodes.   #T2DM During hospitalization, home metformin was held. A1c is 8.1%.   ----------- Patient instructions  You came to the hospital for right heel gangrenous diabetic ulcer. We treated you with zosyn and daptomycin antibiotic therapy for 7 days here in the hospital.    Here are the changes that were made: Amlodipine 5 mg to 10 mg, take one tablet by mouth once daily  Levothyroxine 137 mcg, take 0.5 tablet by mouth at 6 AM Toprol XL 100 mg, take one tablet by mouth every day  Start these new medications for Right heel Ulcer: Augmentin 875-125 mg, take one tablet by mouth twice a day for 28 days Vibramycin 100 mg, take one tablet by mouth every day for 28 days Gabapentin 100 mg, take one capsule by mouth everyday   Continue the rest of your medication as prescribed.   We are glad you are feeling better! Please return to the hospital if you begin to have any new onset chest pain, shortness of breath, fevers, or worsening of symptoms.   Thank you for allowing [place] to take part in your care.    , DO  .

## 2023-01-19 NOTE — Progress Notes (Signed)
Patient has  right necrotic heel wound conversation via chat with Dr. Hassan Rowan about transitional care for the patient.

## 2023-01-19 NOTE — ED Provider Notes (Signed)
[place] EMERGENCY DEPARTMENT AT [facility] Provider Note   CSN: [id number] Arrival date & time: 01/19/23  1326     History  Chief Complaint  Patient presents with   Wound Check    Amber Dudley is a 89 y.o. female, history of diabetes, here for a right foot wound, that is been going on for the last couple days.  She states that a couple days ago she hit her right back of her foot on something, and she developed a wound, it started bleeding profusely.  States that she got it to stop bleeding, but since then it has been open, and now has become foul-smelling, and more tender to the touch.  She has not had any fevers, chills.  Notes that she does not remember stepping on anything, but is kind of grazed her foot against something.  Unknown last A1c per patient.  Home Medications Prior to Admission medications   Medication Sig Start Date End Date Taking? Authorizing Provider  amLODipine (NORVASC) 5 MG tablet Take 5 mg by mouth daily. 04/06/20   [provider]  aspirin EC 81 MG tablet Take 162 mg by mouth daily.    [provider]  Cholecalciferol (VITAMIN D) 2000 units CAPS Take by mouth daily.    [provider]  Cyanocobalamin (VITAMIN B 12) 100 MCG LOZG Take 500 mg by mouth daily.    [provider]  furosemide (LASIX) 20 MG tablet Take 20 mg by mouth.    [provider]  glimepiride (AMARYL) 2 MG tablet Take 0.5 tablets by mouth daily. 08/12/17   [provider]  Insulin Detemir (LEVEMIR FLEXTOUCH) 100 UNIT/ML Pen Inject 15-20 Units into the skin See admin instructions. Inject 20 units in the morning and 15 units every evening for diabetes    [provider]  levothyroxine (SYNTHROID, LEVOTHROID) 125 MCG tablet Take 0.5 tablets by mouth daily before breakfast.  07/18/17   [provider]  losartan (COZAAR) 100 MG tablet Take 100 mg by mouth daily.    [provider]  metFORMIN (GLUCOPHAGE) 1000 MG  tablet Take 1,000 mg by mouth 2 (two) times daily with a meal.    [provider]  metoprolol succinate (TOPROL-XL) 50 MG 24 hr tablet Take 50 mg by mouth daily. Take with or immediately following a meal.    [provider]  Multiple Vitamin (MULTIVITAMIN) capsule Take 1 capsule by mouth daily.    [provider]  pravastatin (PRAVACHOL) 10 MG tablet Take 10 mg by mouth every other day.     [provider]  vitamin C (ASCORBIC ACID) 500 MG tablet Take 500 mg by mouth daily.    [provider]      Allergies    Patient has no known allergies.    Review of Systems   Review of Systems  Constitutional:  Negative for fever.  Skin:  Positive for wound.    Physical Exam Updated Vital Signs BP (!) 145/62 (BP Location: Right Arm)   Pulse 80   Temp 98.5 F (36.9 C) (Oral)   Resp (!) 22   Ht 5\' 3"  (1.6 m)   Wt 86.2 kg   SpO2 96%   BMI 33.66 kg/m  Physical Exam Vitals and nursing note reviewed.  Constitutional:      General: She is not in acute distress.    Appearance: She is well-developed.  HENT:     Head: Normocephalic and atraumatic.  Eyes:  Conjunctiva/sclera: Conjunctivae normal.  Cardiovascular:     Rate and Rhythm: Normal rate and regular rhythm.     Heart sounds: No murmur heard. Pulmonary:     Effort: Pulmonary effort is normal. No respiratory distress.     Breath sounds: Normal breath sounds.  Abdominal:     Palpations: Abdomen is soft.     Tenderness: There is no abdominal tenderness.  Musculoskeletal:        General: No swelling.     Cervical back: Neck supple.     Right lower leg: Edema present.     Left lower leg: Edema present.  Skin:    General: Skin is warm and dry.     Capillary Refill: Capillary refill takes less than 2 seconds.     Comments: Erythema bilateral lower extremities.  With large wound noted on plantar aspect of right heel.  See picture for further details.  Purulent discharge, as well as  foul-smelling  Neurological:     Mental Status: She is alert.  Psychiatric:        Mood and Affect: Mood normal.     ED Results / Procedures / Treatments   Labs (all labs ordered are listed, but only abnormal results are displayed) Labs Reviewed  BASIC METABOLIC PANEL - Abnormal; Notable for the following components:      Result Value   Glucose, Bld 150 (*)    Creatinine, Ser 1.19 (*)    Calcium 8.4 (*)    GFR, Estimated 43 (*)    All other components within normal limits  CBC WITH DIFFERENTIAL/PLATELET - Abnormal; Notable for the following components:   RBC 3.32 (*)    Hemoglobin 9.7 (*)    HCT 31.0 (*)    Platelets 440 (*)    All other components within normal limits  AEROBIC CULTURE W GRAM STAIN (SUPERFICIAL SPECIMEN)  CULTURE, BLOOD (ROUTINE X 2)  CULTURE, BLOOD (ROUTINE X 2)  LACTIC ACID, PLASMA  LACTIC ACID, PLASMA    EKG None  Radiology DG Foot Complete Right  Result Date: 01/19/2023 CLINICAL DATA:  Pain in right foot. EXAM: RIGHT FOOT COMPLETE - 3+ VIEW COMPARISON:  None Available. FINDINGS: There is marked diffuse soft tissue edema about the right foot. Soft tissue ulceration overlying the plantar surface of the calcaneus is identified with overlying bandage material and subcutaneous gas. No signs focal bony erosion of the calcaneus. Suspected needle fragment noted within the soft tissues along the plantar surface of the second digit at the level of the second middle phalanx Second through fifth hammertoe deformities. No signs of acute fracture. Degenerative changes are identified between the tarsal bones. IMPRESSION: 1. Soft tissue ulceration overlying the plantar surface of the calcaneus with overlying bandage material and subcutaneous gas. 2. No signs of focal bony erosion to suggest osteomyelitis. 3. Suspected needle fragment within the soft tissues along the plantar surface of the second digit at the level of the second middle phalanx. 4. Marked diffuse soft tissue  edema about the right foot. 5. Second through fifth hammertoe deformities. Electronically Signed   By: Signa Kell M.D.   On: 01/19/2023 15:10    Procedures Procedures    Medications Ordered in ED Medications  piperacillin-tazobactam (ZOSYN) IVPB 3.375 g (has no administration in time range)    ED Course/ Medical Decision Making/ A&P                             Medical  Decision Making Patient is a 89 year old female, here for right foot pain, trauma that is been going on for the last couple days, it appears to be red, infected, with purulent drainage and foul-smelling.  See picture for further information we will obtain x-ray, for further information, and blood work including lactic acid.  Will start on IV antibiotics.  Amount and/or Complexity of Data Reviewed Labs: ordered.    Details: No leukocytosis, creatinine within normal limits Radiology: ordered.    Details: X-ray shows soft tissue with some gas, as well as suspected needle fragment Discussion of management or test interpretation with external provider(s): Discussed with patient, does have wound infection, that is grossly infected, we will start him on vancomycin, and Zosyn.  Will require IV antibiotics given degree of wound infection, and will likely need a consult orthopedics inpatient.  Will hold off on MRI given suspected needle fragment at this time.  Admitted to Dr. Lily Kocher, internal medicine, for further care.  Patient requiring IV antibiotics, wound care, and possible debridement requiring hospitalization.  Increased complexity given diabetes.  Poor wound healing.  Risk Prescription drug management. Decision regarding hospitalization.    Final Clinical Impression(s) / ED Diagnoses Final diagnoses:  Diabetic infection of right foot [facility])    Rx / DC Orders ED Discharge Orders     None         , Harley Alto, PA 01/19/23 1720    Laurence Spates, MD 01/20/23 1547

## 2023-01-19 NOTE — ED Notes (Signed)
ED TO INPATIENT HANDOFF REPORT  ED Nurse Name and Phone #:  Marisue Ivan 53  S Name/Age/Gender Amber Dudley 89 y.o. female Room/Bed: H020C/H020C  Code Status   Code Status: Full Code  Home/SNF/Other Home Patient oriented to: self, place, time, and situation Is this baseline? Yes   Triage Complete: Triage complete  Chief Complaint Diabetic wet gangrene of the foot [facility]) [E11.52]  Triage Note Pt states has had a wound on right heel for a month and pt hit the wound on something twice two days ago and the wound is worse and it kept bleeding. Pt has wound on entire right heel. The bedding of wound is necrotic. The wound has a foul smell. Pt's legs are erythematous. Pt has 4+ swelling of legs bilat.   Allergies No Known Allergies  Level of Care/Admitting Diagnosis ED Disposition     ED Disposition  Admit   Condition  --   Comment  Hospital Area: [facility] [[id number]]  Level of Care: Telemetry Medical [104]  May place patient in observation at [facility] or [facility] if equivalent level of care is available:: No  Covid Evaluation: Asymptomatic - no recent exposure (last 10 days) testing not required  Diagnosis: Diabetic wet gangrene of the foot [facility]) [[id number]]  Admitting Physician: Ginnie Smart [[id number]]  Attending Physician: HATCHER, JEFFREY C [[id number]]          B Medical/Surgery History Past Medical History:  Diagnosis Date   Diabetes mellitus, type II (HCC)    Hyperlipidemia    Hypertension    Hypothyroidism    Past Surgical History:  Procedure Laterality Date   CATARACT EXTRACTION     OTHER SURGICAL HISTORY     OTHER SURGICAL HISTORY     L hip   OTHER SURGICAL HISTORY     L Arm fx   TONSILLECTOMY       A IV Location/Drains/Wounds Patient Lines/Drains/Airways Status     Active Line/Drains/Airways     Name Placement date Placement time Site Days   Peripheral IV 01/19/23 20 G Right Antecubital 01/19/23  1730  Antecubital  less than 1             Intake/Output Last 24 hours No intake or output data in the 24 hours ending 01/19/23 1835  Labs/Imaging Results for orders placed or performed during the hospital encounter of 01/19/23 (from the past 48 hour(s))  Basic metabolic panel     Status: Abnormal   Collection Time: 01/19/23  2:28 PM  Result Value Ref Range   Sodium 138 135 - 145 mmol/L   Potassium 3.5 3.5 - 5.1 mmol/L   Chloride 103 98 - 111 mmol/L   CO2 27 22 - 32 mmol/L   Glucose, Bld 150 (H) 70 - 99 mg/dL    Comment: Glucose reference range applies only to samples taken after fasting for at least 8 hours.   BUN 11 8 - 23 mg/dL   Creatinine, Ser 0.98 (H) 0.44 - 1.00 mg/dL   Calcium 8.4 (L) 8.9 - 10.3 mg/dL   GFR, Estimated 43 (L) >60 mL/min    Comment: (NOTE) Calculated using the CKD-EPI Creatinine Equation (2021)    Anion gap 8 5 - 15    Comment: Performed at [facility], [address], [place], [place] [address]  CBC with Differential     Status: Abnormal   Collection Time: 01/19/23  2:28 PM  Result Value Ref Range   WBC 7.0 4.0 -  10.5 K/uL   RBC 3.32 (L) 3.87 - 5.11 MIL/uL   Hemoglobin 9.7 (L) 12.0 - 15.0 g/dL   HCT 11.9 (L) 14.7 - 82.9 %   MCV 93.4 80.0 - 100.0 fL   MCH 29.2 26.0 - 34.0 pg   MCHC 31.3 30.0 - 36.0 g/dL   RDW 56.2 13.0 - 86.5 %   Platelets 440 (H) 150 - 400 K/uL   nRBC 0.0 0.0 - 0.2 %   Neutrophils Relative % 68 %   Neutro Abs 4.8 1.7 - 7.7 K/uL   Lymphocytes Relative 16 %   Lymphs Abs 1.1 0.7 - 4.0 K/uL   Monocytes Relative 10 %   Monocytes Absolute 0.7 0.1 - 1.0 K/uL   Eosinophils Relative 4 %   Eosinophils Absolute 0.3 0.0 - 0.5 K/uL   Basophils Relative 1 %   Basophils Absolute 0.1 0.0 - 0.1 K/uL   Immature Granulocytes 1 %   Abs Immature Granulocytes 0.05 0.00 - 0.07 K/uL    Comment: Performed at [facility], [address], [place], [place] [address]   DG Foot Complete Right  Result Date: 01/19/2023 CLINICAL DATA:  Pain in right foot. EXAM: RIGHT  FOOT COMPLETE - 3+ VIEW COMPARISON:  None Available. FINDINGS: There is marked diffuse soft tissue edema about the right foot. Soft tissue ulceration overlying the plantar surface of the calcaneus is identified with overlying bandage material and subcutaneous gas. No signs focal bony erosion of the calcaneus. Suspected needle fragment noted within the soft tissues along the plantar surface of the second digit at the level of the second middle phalanx Second through fifth hammertoe deformities. No signs of acute fracture. Degenerative changes are identified between the tarsal bones. IMPRESSION: 1. Soft tissue ulceration overlying the plantar surface of the calcaneus with overlying bandage material and subcutaneous gas. 2. No signs of focal bony erosion to suggest osteomyelitis. 3. Suspected needle fragment within the soft tissues along the plantar surface of the second digit at the level of the second middle phalanx. 4. Marked diffuse soft tissue edema about the right foot. 5. Second through fifth hammertoe deformities. Electronically Signed   By: Signa Kell M.D.   On: 01/19/2023 15:10    Pending Labs Unresulted Labs (From admission, onward)     Start     Ordered   01/19/23 1829  TSH  Once,   R        01/19/23 1832   01/19/23 1721  Blood culture (routine x 2)  BLOOD CULTURE X 2,   R (with STAT occurrences)      01/19/23 1720   01/19/23 1639  Aerobic Culture w Gram Stain (superficial specimen)  Once,   URGENT        01/19/23 1639   01/19/23 1638  Lactic acid, plasma  Now then every 2 hours,   R (with STAT occurrences)      01/19/23 1639            Vitals/Pain Today's Vitals   01/19/23 1350 01/19/23 1415 01/19/23 1817  BP: (!) 145/62  (!) 192/68  Pulse: 80  92  Resp: (!) 22  20  Temp: 98.5 F (36.9 C)  97.9 F (36.6 C)  TempSrc: Oral  Oral  SpO2: 96%  98%  Weight:  86.2 kg   Height:  5\' 3"  (1.6 m)   PainSc:  0-No pain     Isolation Precautions No active  isolations  Medications Medications  piperacillin-tazobactam (ZOSYN) IVPB 3.375 g (  3.375 g Intravenous New Bag/Given 01/19/23 1811)  amLODipine (NORVASC) tablet 5 mg (has no administration in time range)  metoprolol succinate (TOPROL-XL) 24 hr tablet 50 mg (has no administration in time range)  pravastatin (PRAVACHOL) tablet 10 mg (has no administration in time range)  Vitamin B 12 LOZG 500 mg (has no administration in time range)  multivitamin capsule 1 capsule (has no administration in time range)  acetaminophen (TYLENOL) tablet 650 mg (has no administration in time range)    Or  acetaminophen (TYLENOL) suppository 650 mg (has no administration in time range)  senna-docusate (Senokot-S) tablet 1 tablet (has no administration in time range)  albuterol (PROVENTIL) (2.5 MG/3ML) 0.083% nebulizer solution 2.5 mg (has no administration in time range)  vancomycin (VANCOREADY) IVPB 2000 mg/400 mL (has no administration in time range)    Mobility walks     Focused Assessments Cardiac Assessment Handoff:    No results found for: "CKTOTAL", "CKMB", "CKMBINDEX", "TROPONINI" No results found for: "DDIMER" Does the Patient currently have chest pain? No   , Pulmonary Assessment Handoff:  Lung sounds:   O2 Device: Room Air      R Recommendations: See Admitting Provider Note  Report given to:   Additional Notes:

## 2023-01-19 NOTE — Progress Notes (Signed)
Discussed patient with medicine team. Patient is a diabetic with month subacute infected heel ulcer. Normal white count and hemodynamically stable. Plan to be admitted to medicine. Would recommend ABI's given concern for PVD. Please make NPO at midnight for possible debridement tomorrow. Full consult note to follow.

## 2023-01-19 NOTE — H&P (Addendum)
Date: 01/19/2023               Patient Name:  Amber Dudley MRN: [id number]  DOB: 07/20/1930 Age / Sex: 89 y.o., female   PCP: Garald Braver         Medical Service: Internal Medicine Teaching Service         Attending Physician: Dr. Ginnie Smart, MD      First Contact: Dr. Faith Rogue, DO Pager [phone]    Second Contact: Dr. Morene Crocker, MD Pager [phone]         After Hours (After 5p/  First Contact Pager: [phone]  weekends / holidays): Second Contact Pager: [phone]   SUBJECTIVE   Chief Complaint: Right foot wound  History of Present Illness: Amber Dudley is a pleasant 89 year old woman with a past medical history of HTN, Insulin dependent T2DM, Hypothyroidism, and chronic lower extremity edema who presented to the emergency department after she hit her right foot "badly" about two days ago and noticed a large amount of bleeding. She reported having "bad legs" in reference to edema/swelling of her bilateral lower extremities for years that she has been taking lasix for. She does have pain while weightbearing and denies pain at rest. Patient denies lack of sensation/neuropathy symptoms on her lower extremities even though she does not remember stepping on a needle that is embedded in her right fifth metatarsal. She denies use of shoes in the house the past few days. Patient also denies systemic symptoms such as CP, SOB, lightheaded, nausea, vomiting, fever, or chills.   She denies being hospitalized within the past 30 days and denies antibiotic use within the last 30 days. She does not believe that she has had MRSA in the past.   Of note, she does have symptoms of stress incontinence and her last BM was today.   The last time she saw her PCP was about 4-6 months ago: Mariana Kaufman NP.  ED Course: Patient presented with chronic appearing right diabetic foot ulcer. On Xray, it shows soft tissue ulceration overlying the plantar surface of the calcaneus with overlying  bandage material and subcutaneous gas along but no focal bony erosions to suggest osteomyelitis. She remained afebrile and does not have leukocytosis. She was admitted to the IMT[facility] for evaluation and treatment.   Meds:    No outpatient medications have been marked as taking for the 01/19/23 encounter [facility] Encounter).    Past Medical History  Hypertension Insulin Dependent T2DM Hyperlipidemia  R Macular degeneration Hypothyroidism  Chronic lower extremity edema bilateral   Past Surgical History:  Procedure Laterality Date   CATARACT EXTRACTION     OTHER SURGICAL HISTORY     OTHER SURGICAL HISTORY     L hip   OTHER SURGICAL HISTORY     L Arm fx   TONSILLECTOMY      Social:  Lives With: alone, grandson is across the street Occupation:none Support:grandson Level of Function:independent with ADLs, some support with iADLs, wheelchair, walker, and cane use PCP: NP: Mariana Kaufman in [place]  Substances:No tobacco, alcohol, or illicit drug use   Family History: N/A  Allergies: Allergies as of 01/19/2023   (No Known Allergies)    Review of Systems: A complete ROS was negative except as per HPI.   OBJECTIVE:   Physical Exam: Blood pressure (!) 145/62, pulse 80, temperature 98.5 F (36.9 C), temperature source Oral, resp. rate (!) 22, height 5\' 3"  (1.6 m), weight 86.2 kg, SpO2 96%.  Constitutional: elderly appearing, laying in bed, NAD, tearful while explaining the plan Cardiovascular: Irregularly irregular rhythm, no murmurs auscultated  Pulmonary/Chest: normal work of breathing on room air, lungs clear to auscultation bilaterally. No crackles  Abdominal: soft, non-tender, non-distended. Neurological: alert & oriented x 3 MSK:  bilateral LE edema to the knee, erythema with papillomatosis to the level of the knees, large right calcaneal wound with necrosis/gangrene, dorsalis pedalis pulses unable to be palpated, wound is foul smelling, probe test: 1 inch deep Skin:  warm and dry Psych: Normal mood and affect     Labs: CBC    Component Value Date/Time   WBC 7.0 01/19/2023 1428   RBC 3.32 (L) 01/19/2023 1428   HGB 9.7 (L) 01/19/2023 1428   HCT 31.0 (L) 01/19/2023 1428   PLT 440 (H) 01/19/2023 1428   MCV 93.4 01/19/2023 1428   MCH 29.2 01/19/2023 1428   MCHC 31.3 01/19/2023 1428   RDW 14.7 01/19/2023 1428   LYMPHSABS 1.1 01/19/2023 1428   MONOABS 0.7 01/19/2023 1428   EOSABS 0.3 01/19/2023 1428   BASOSABS 0.1 01/19/2023 1428     CMP     Component Value Date/Time   NA 138 01/19/2023 1428   K 3.5 01/19/2023 1428   CL 103 01/19/2023 1428   CO2 27 01/19/2023 1428   GLUCOSE 150 (H) 01/19/2023 1428   BUN 11 01/19/2023 1428   CREATININE 1.19 (H) 01/19/2023 1428   CALCIUM 8.4 (L) 01/19/2023 1428   PROT 8.8 (H) 05/09/2020 1559   ALBUMIN 3.8 05/09/2020 1559   AST 25 05/09/2020 1559   ALT 22 05/09/2020 1559   ALKPHOS 102 05/09/2020 1559   BILITOT 0.5 05/09/2020 1559   GFRNONAA 43 (L) 01/19/2023 1428   GFRAA >60 06/25/2019 1406   Lactic acid: pending Blood cultures: pending Wound culture: pending  Imaging: DG Foot Complete Right  Result Date: 01/19/2023 CLINICAL DATA:  Pain in right foot. EXAM: RIGHT FOOT COMPLETE - 3+ VIEW COMPARISON:  None Available. FINDINGS: There is marked diffuse soft tissue edema about the right foot. Soft tissue ulceration overlying the plantar surface of the calcaneus is identified with overlying bandage material and subcutaneous gas. No signs focal bony erosion of the calcaneus. Suspected needle fragment noted within the soft tissues along the plantar surface of the second digit at the level of the second middle phalanx Second through fifth hammertoe deformities. No signs of acute fracture. Degenerative changes are identified between the tarsal bones. IMPRESSION: 1. Soft tissue ulceration overlying the plantar surface of the calcaneus with overlying bandage material and subcutaneous gas. 2. No signs of focal bony  erosion to suggest osteomyelitis. 3. Suspected needle fragment within the soft tissues along the plantar surface of the second digit at the level of the second middle phalanx. 4. Marked diffuse soft tissue edema about the right foot. 5. Second through fifth hammertoe deformities. Electronically Signed   By: Signa Kell M.D.   On: 01/19/2023 15:10      EKG: Sinus rhythm with 1st degree heart block, no ST elevations observed    ASSESSMENT & PLAN:   Assessment & Plan by Problem: Active Problems:   * No active hospital problems. *   Amber Dudley is a 89 y.o. person living with a history of HTN, T2DM, hypothyroidism, chronic lower extremity edema  who presented with a necrotic right calcaneal ulcer and admitted for a right calcaneal diabetic ulcer with gangrene on hospital day 0  Right diabetic gangrene ulcer  Possible lymphedema Patient  presented with a chronic appearing diabetic foot ulcer that is necrotic/gangrene appearing. Her grandson reported noticing a foul smell in her house about a month ago but patient is unsure how new this ulcer/wound is. In the setting of diabetes with a gangrene appearing foot ulcer that has gaseous changes on xray images along with soft tissue ulceration without focal bony lesions, orthopedic was paged to evaluate the patient for potential debridement/amputation. -Orthopedics consulted: appreciate recommendations: spoke with Blanchie Dessert who will discuss case with Dr. Lajoyce Corners and likely see patient in the morning, [place] recommends:  -NO MRI since it would not change their management, hold off on MRI for now due to suspected needle in right foot  -Ortho believes the wound is chronic in nature due to history and stated that the wound is "stable for the time being" and recommended ABI's -Will follow up on their recommendations   -Wound care consulted  -IV vancomycin and zosyn  -Blood cultures collected -Wound cultures collected -Lactic acid collected -Check  A1c -NPO midnight    Irregular rhythm auscultated, 1st degree heart block with PACs Patient denies having arrhythmias in the past and also denies history of atrial fibrillation. PAC's and 1st degree heart block observed on EKG: could be multifactorial due to age, infection, and history of thyroid disease (hypothyroidism). -Continuous telemetry -Will obtain EKG -Will obtain CXR -Will obtain echo -Check: TSH -CBC: no leukocytosis, Hbg 9.7 -BMP: K+ 3.5, check magnesium  -Supplement K+ -Hold off on anticoagulation for potential orthopedic procedure    T2DM -SSI insulin  -Check A1c -Hold home metformin   HTN Begin Amlodipine and Toprol-xl   Hypothyroidism Patient denies taking synthroid, will check TSH  AKI Cr 1.19, check tomorrow: maybe baseline due to most recent lab value charted 2 years ago  -Hold furosemide -Hold losartan   HLD: -lipid panel -continue pravastatin   Urinary Incontinence -Bladder scan ordered    Diet: Heart healthy  VTE: SCDs Code: Full  Prior to Admission Living Arrangement: Home, living alone Anticipated Discharge Location: SNF Barriers to Discharge: Medical   Dispo: Admit patient to Observation with expected length of stay less than 2 midnights.  Signed:   Faith Rogue  Internal Medicine Resident PGY-1 [phone] 01/19/2023, 6:05 PM   Please contact the on call pager after 5 pm and on weekends at [phone].

## 2023-01-19 NOTE — Progress Notes (Signed)
Pharmacy Antibiotic Note  Amber Dudley is a 89 y.o. female admitted on 01/19/2023 with  wound infection .  Pharmacy has been consulted for vancomycin and Zosyn dosing. Renal function baseline unknown, currently up from previous result but from several years ago.   Plan: Vancomycin 2000 mg IV x1 (given in ED), followed by vancomycin 500 mg IV Q24h (eAUC 470, goal AUC 400-550) Zosyn 3.375g IV Q8h  Trend WBC, fever, renal function F/u cultures, clinical progress, levels as indicated De-escalate when able  Height: 5\' 3"  (160 cm) Weight: 82 kg (180 lb 12.4 oz) IBW/kg (Calculated) : 52.4  Temp (24hrs), Avg:98 F (36.7 C), Min:97.6 F (36.4 C), Max:98.5 F (36.9 C)  Recent Labs  Lab 01/19/23 1428 01/19/23 1735  WBC 7.0  --   CREATININE 1.19*  --   LATICACIDVEN  --  1.8    Estimated Creatinine Clearance: 31.8 mL/min (A) (by C-G formula based on SCr of 1.19 mg/dL (H)).    No Known Allergies  Microbiology results: 7/18 BCx: pending  Thank you for allowing pharmacy to be a part of this patient's care.  Carrington Clamp 01/19/2023 8:05 PM

## 2023-01-20 ENCOUNTER — Observation Stay (HOSPITAL_BASED_OUTPATIENT_CLINIC_OR_DEPARTMENT_OTHER): Payer: Medicare Other

## 2023-01-20 ENCOUNTER — Observation Stay (HOSPITAL_COMMUNITY): Payer: Medicare Other

## 2023-01-20 DIAGNOSIS — E11628 Type 2 diabetes mellitus with other skin complications: Secondary | ICD-10-CM | POA: Diagnosis not present

## 2023-01-20 DIAGNOSIS — E039 Hypothyroidism, unspecified: Secondary | ICD-10-CM

## 2023-01-20 DIAGNOSIS — E1165 Type 2 diabetes mellitus with hyperglycemia: Secondary | ICD-10-CM

## 2023-01-20 DIAGNOSIS — L089 Local infection of the skin and subcutaneous tissue, unspecified: Principal | ICD-10-CM

## 2023-01-20 DIAGNOSIS — Z7984 Long term (current) use of oral hypoglycemic drugs: Secondary | ICD-10-CM

## 2023-01-20 DIAGNOSIS — M79673 Pain in unspecified foot: Secondary | ICD-10-CM | POA: Diagnosis not present

## 2023-01-20 DIAGNOSIS — R008 Other abnormalities of heart beat: Secondary | ICD-10-CM | POA: Diagnosis not present

## 2023-01-20 DIAGNOSIS — L97519 Non-pressure chronic ulcer of other part of right foot with unspecified severity: Secondary | ICD-10-CM | POA: Diagnosis not present

## 2023-01-20 DIAGNOSIS — E1152 Type 2 diabetes mellitus with diabetic peripheral angiopathy with gangrene: Secondary | ICD-10-CM | POA: Diagnosis not present

## 2023-01-20 DIAGNOSIS — E11621 Type 2 diabetes mellitus with foot ulcer: Secondary | ICD-10-CM

## 2023-01-20 DIAGNOSIS — E43 Unspecified severe protein-calorie malnutrition: Secondary | ICD-10-CM | POA: Diagnosis not present

## 2023-01-20 DIAGNOSIS — I890 Lymphedema, not elsewhere classified: Secondary | ICD-10-CM

## 2023-01-20 DIAGNOSIS — I440 Atrioventricular block, first degree: Secondary | ICD-10-CM

## 2023-01-20 LAB — ECHOCARDIOGRAM COMPLETE
AR max vel: 2.29 cm2
AV Area VTI: 2.36 cm2
AV Area mean vel: 2.31 cm2
AV Mean grad: 6 mmHg
AV Peak grad: 10.2 mmHg
Ao pk vel: 1.6 m/s
Area-P 1/2: 3.85 cm2
Height: 63 in
S' Lateral: 3.8 cm
Weight: 2913.6 oz

## 2023-01-20 LAB — CBC
HCT: 33.1 % — ABNORMAL LOW (ref 36.0–46.0)
Hemoglobin: 10.9 g/dL — ABNORMAL LOW (ref 12.0–15.0)
MCH: 29.9 pg (ref 26.0–34.0)
MCHC: 32.9 g/dL (ref 30.0–36.0)
MCV: 90.7 fL (ref 80.0–100.0)
Platelets: 395 10*3/uL (ref 150–400)
RBC: 3.65 MIL/uL — ABNORMAL LOW (ref 3.87–5.11)
RDW: 14.8 % (ref 11.5–15.5)
WBC: 7.4 10*3/uL (ref 4.0–10.5)
nRBC: 0 % (ref 0.0–0.2)

## 2023-01-20 LAB — RENAL FUNCTION PANEL
Albumin: 2.3 g/dL — ABNORMAL LOW (ref 3.5–5.0)
Anion gap: 8 (ref 5–15)
BUN: 12 mg/dL (ref 8–23)
CO2: 27 mmol/L (ref 22–32)
Calcium: 8.5 mg/dL — ABNORMAL LOW (ref 8.9–10.3)
Chloride: 107 mmol/L (ref 98–111)
Creatinine, Ser: 0.95 mg/dL (ref 0.44–1.00)
GFR, Estimated: 57 mL/min — ABNORMAL LOW (ref >60)
Glucose, Bld: 129 mg/dL — ABNORMAL HIGH (ref 70–99)
Phosphorus: 3.4 mg/dL (ref 2.5–4.6)
Potassium: 3.8 mmol/L (ref 3.5–5.1)
Sodium: 142 mmol/L (ref 135–145)

## 2023-01-20 LAB — GLUCOSE, CAPILLARY
Glucose-Capillary: 124 mg/dL — ABNORMAL HIGH (ref 70–99)
Glucose-Capillary: 101 mg/dL — ABNORMAL HIGH (ref 70–99)
Glucose-Capillary: 138 mg/dL — ABNORMAL HIGH (ref 70–99)
Glucose-Capillary: 161 mg/dL — ABNORMAL HIGH (ref 70–99)

## 2023-01-20 LAB — CULTURE, BLOOD (ROUTINE X 2)

## 2023-01-20 LAB — VAS US ABI WITH/WO TBI

## 2023-01-20 LAB — CK: Total CK: 150 U/L (ref 38–234)

## 2023-01-20 MED ORDER — GABAPENTIN 100 MG PO CAPS
ORAL_CAPSULE | ORAL | Status: AC
  Filled 2023-01-20: qty 1

## 2023-01-20 MED ORDER — MAGNESIUM SULFATE 2 GM/50ML IV SOLN
INTRAVENOUS | Status: AC
  Filled 2023-01-20: qty 50

## 2023-01-20 MED ORDER — GABAPENTIN 300 MG PO CAPS
ORAL_CAPSULE | ORAL | Status: AC
  Filled 2023-01-20: qty 1

## 2023-01-20 MED ORDER — GADOBUTROL 1 MMOL/ML IV SOLN
8.0000 mL | Freq: Once | INTRAVENOUS | Status: AC | PRN
  Administered 2023-01-20: 8 mL via INTRAVENOUS

## 2023-01-20 MED ORDER — SODIUM CHLORIDE 0.9 % IV SOLN
8.0000 mg/kg | Freq: Every day | INTRAVENOUS | Status: DC
  Administered 2023-01-21 – 2023-01-25 (×5): 500 mg via INTRAVENOUS
  Filled 2023-01-20 (×5): qty 10

## 2023-01-20 MED ORDER — ENOXAPARIN SODIUM 40 MG/0.4ML IJ SOSY: 40.0000 mg | PREFILLED_SYRINGE | INTRAMUSCULAR | Status: DC

## 2023-01-20 MED ORDER — METOPROLOL SUCCINATE ER 50 MG PO TB24
100.0000 mg | ORAL_TABLET | Freq: Every day | ORAL | Status: DC
  Administered 2023-01-20 – 2023-01-31 (×12): 100 mg via ORAL
  Filled 2023-01-20 (×11): qty 2

## 2023-01-20 MED ORDER — GABAPENTIN 100 MG PO CAPS
100.0000 mg | ORAL_CAPSULE | Freq: Every day | ORAL | Status: DC
  Administered 2023-01-21 – 2023-01-31 (×11): 100 mg via ORAL
  Filled 2023-01-20 (×11): qty 1

## 2023-01-20 MED ORDER — ENOXAPARIN SODIUM 40 MG/0.4ML IJ SOSY
40.0000 mg | PREFILLED_SYRINGE | INTRAMUSCULAR | Status: DC
  Administered 2023-01-20 – 2023-01-30 (×11): 40 mg via SUBCUTANEOUS
  Filled 2023-01-20 (×11): qty 0.4

## 2023-01-20 MED ORDER — GERHARDT'S BUTT CREAM
TOPICAL_CREAM | Freq: Two times a day (BID) | CUTANEOUS | Status: DC
  Filled 2023-01-20 (×2): qty 1

## 2023-01-20 MED ORDER — MAGNESIUM SULFATE 2 GM/50ML IV SOLN
2.0000 g | Freq: Once | INTRAVENOUS | Status: AC
  Administered 2023-01-20: 2 g via INTRAVENOUS

## 2023-01-20 MED ORDER — HYDROCERIN EX CREA
TOPICAL_CREAM | Freq: Every day | CUTANEOUS | Status: DC
  Filled 2023-01-20 (×3): qty 113

## 2023-01-20 MED ORDER — SODIUM CHLORIDE 0.9 % IV SOLN
6.0000 mg/kg | Freq: Every day | INTRAVENOUS | Status: DC
  Filled 2023-01-20: qty 10

## 2023-01-20 MED ORDER — LEVOTHYROXINE SODIUM 25 MCG PO TABS
68.5000 ug | ORAL_TABLET | Freq: Every day | ORAL | Status: DC
  Administered 2023-01-21 – 2023-01-31 (×11): 68.5 ug via ORAL
  Filled 2023-01-20 (×12): qty 1

## 2023-01-20 NOTE — Progress Notes (Signed)
ABI's have been completed. Preliminary results can be found in CV Proc through chart review.   01/20/23 11:39 AM Olen Cordial RVT

## 2023-01-20 NOTE — Progress Notes (Signed)
*  PRELIMINARY RESULTS* Echocardiogram 2D Echocardiogram has been performed.  [name] 01/20/2023, 11:37 AM

## 2023-01-20 NOTE — Consult Note (Signed)
ORTHOPAEDIC CONSULTATION  REQUESTING PHYSICIAN: Ginnie Smart, MD  Chief Complaint: lymphedema and necrotic ulcer right heel  HPI: Amber Dudley is a 89 y.o. female who presents with necrotic ulcer right heel. Chronic lymphedema bilateral lower extremities  Patient live home alone.  Past Medical History:  Diagnosis Date   Diabetes mellitus, type II (HCC)    Hyperlipidemia    Hypertension    Hypothyroidism    Past Surgical History:  Procedure Laterality Date   CATARACT EXTRACTION     OTHER SURGICAL HISTORY     OTHER SURGICAL HISTORY     L hip   OTHER SURGICAL HISTORY     L Arm fx   TONSILLECTOMY     Social History   Socioeconomic History   Marital status: Widowed    Spouse name: Not on file   Number of children: Not on file   Years of education: Not on file   Highest education level: Not on file  Occupational History   Not on file  Tobacco Use   Smoking status: Never   Smokeless tobacco: Never  Substance and Sexual Activity   Alcohol use: No    Alcohol/week: 0.0 standard drinks of alcohol   Drug use: No   Sexual activity: Not on file  Other Topics Concern   Not on file  Social History Narrative   Not on file   Social Determinants of Health   Financial Resource Strain: Not on file  Food Insecurity: Not on file  Transportation Needs: Not on file  Physical Activity: Not on file  Stress: Not on file  Social Connections: Not on file   Family History  Problem Relation Age of Onset   Heart failure Mother    Heart disease Father    Stroke Father    Diabetes Sister    Hypertension Sister    Heart failure Sister    Cancer Sister    - negative except otherwise stated in the family history section No Known Allergies Prior to Admission medications   Medication Sig Start Date End Date Taking? Authorizing Provider  amLODipine (NORVASC) 5 MG tablet Take 5 mg by mouth daily. 04/06/20   [provider]  aspirin EC 81 MG tablet Take 81 mg by  mouth daily.    [provider]  Cholecalciferol (VITAMIN D) 2000 units CAPS Take by mouth daily.    [provider]  Cyanocobalamin (VITAMIN B 12) 100 MCG LOZG Take 500 mg by mouth daily.    [provider]  furosemide (LASIX) 20 MG tablet Take 20 mg by mouth.    [provider]  glimepiride (AMARYL) 2 MG tablet Take 0.5 tablets by mouth daily. 08/12/17   [provider]  Insulin Detemir (LEVEMIR FLEXTOUCH) 100 UNIT/ML Pen Inject 15-20 Units into the skin See admin instructions. Inject 20 units in the morning and 15 units every evening for diabetes    [provider]  levothyroxine (SYNTHROID, LEVOTHROID) 125 MCG tablet Take 0.5 tablets by mouth daily before breakfast.  07/18/17   [provider]  losartan (COZAAR) 100 MG tablet Take 100 mg by mouth daily.    [provider]  metFORMIN (GLUCOPHAGE) 1000 MG tablet Take 1,000 mg by mouth 2 (two) times daily with a meal.    [provider]  metoprolol succinate (TOPROL-XL) 50 MG 24 hr tablet Take 50 mg by mouth daily. Take with or immediately following a meal.    [provider]  Multiple Vitamin (  MULTIVITAMIN) capsule Take 1 capsule by mouth daily.    [provider]  pravastatin (PRAVACHOL) 10 MG tablet Take 10 mg by mouth every other day.     [provider]  vitamin C (ASCORBIC ACID) 500 MG tablet Take 500 mg by mouth daily.    [provider]   VAS [place] ABI WITH/WO TBI  Result Date: 01/20/2023  LOWER EXTREMITY DOPPLER STUDY Patient Name:  Amber Dudley  Date of Exam:   01/20/2023 Medical Rec #: [id number]      Accession #:    [id number] Date of Birth: [birth year]       Patient Gender: F Patient Age:   15 years Exam Location:  [facility] Procedure:      VAS [place] ABI WITH/WO TBI Referring Phys: JEFFREY HATCHER --------------------------------------------------------------------------------  Indications: Ulceration. High Risk Factors:  Hypertension, hyperlipidemia, Diabetes.  Limitations: Today's exam was limited due to patient intolerant to cuff              pressure, an open wound, involuntary patient movement and skin              induration, L arm IV. Comparison Study: No prior studies. Performing Technologist: Olen Cordial RVT  Examination Guidelines: A complete evaluation includes at minimum, Doppler waveform signals and systolic blood pressure reading at the level of bilateral brachial, anterior tibial, and posterior tibial arteries, when vessel segments are accessible. Bilateral testing is considered an integral part of a complete examination. Photoelectric Plethysmograph (PPG) waveforms and toe systolic pressure readings are included as required and additional duplex testing as needed. Limited examinations for reoccurring indications may be performed as noted.  ABI Findings: +---------+------------------+-----+----------+--------+ Right    Rt Pressure (mmHg)IndexWaveform  Comment  +---------+------------------+-----+----------+--------+ Brachial                                  IV       +---------+------------------+-----+----------+--------+ PTA      247               1.56 monophasic         +---------+------------------+-----+----------+--------+ DP       130               0.82 monophasic         +---------+------------------+-----+----------+--------+ Great Toe51                0.32                    +---------+------------------+-----+----------+--------+ +---------+------------------+-----+----------+-------+ Left     Lt Pressure (mmHg)IndexWaveform  Comment +---------+------------------+-----+----------+-------+ Brachial 158                    triphasic         +---------+------------------+-----+----------+-------+ PTA      237               1.50 monophasic        +---------+------------------+-----+----------+-------+ DP       254               1.61 monophasic         +---------+------------------+-----+----------+-------+ Great Toe62                0.39                   +---------+------------------+-----+----------+-------+ +-------+-----------+-----------+------------+------------+ ABI/TBIToday's ABIToday's TBIPrevious ABIPrevious TBI +-------+-----------+-----------+------------+------------+ Right  [place]  0.32                                +-------+-----------+-----------+------------+------------+ Left   [place]         0.39                                +-------+-----------+-----------+------------+------------+   Summary: Right: Resting right ankle-brachial index indicates noncompressible right lower extremity arteries. The right toe-brachial index is abnormal. Left: Resting left ankle-brachial index indicates noncompressible left lower extremity arteries. The left toe-brachial index is abnormal. *See table(s) above for measurements and observations.  Electronically signed by Sherald Hess MD on 01/20/2023 at 2:10:57 PM.    Final    ECHOCARDIOGRAM COMPLETE  Result Date: 01/20/2023    ECHOCARDIOGRAM REPORT   Patient Name:   Amber Dudley Date of Exam: 01/20/2023 Medical Rec #:  [id number]     Height:       63.0 in Accession #:    [id number]    Weight:       182.1 lb Date of Birth:  1931-07-04      BSA:          1.858 m Patient Age:    89 years      BP:           162/96 mmHg Patient Gender: F             HR:           81 bpm. Exam Location:  Inpatient Procedure: 2D Echo, Cardiac Doppler and Color Doppler Indications:    Other abnormalities of the heart R00.8  History:        Patient has no prior history of Echocardiogram examinations.                 PAD; Risk Factors:Non-Smoker, Diabetes and Dyslipidemia.  Sonographer:    Dondra Prader RVT RCS Referring Phys: [id number] JEFFREY C HATCHER  Sonographer Comments: Patient done in supine position IMPRESSIONS  1. Left ventricular ejection fraction, by estimation, is 60 to 65%. The left ventricle has normal  function. The left ventricle has no regional wall motion abnormalities. Left ventricular diastolic parameters are consistent with Grade I diastolic dysfunction (impaired relaxation).  2. Right ventricular systolic function is normal. The right ventricular size is normal.  3. Left atrial size was moderately dilated.  4. The mitral valve is normal in structure. Mild mitral valve regurgitation. No evidence of mitral stenosis.  5. The aortic valve is tricuspid. There is mild calcification of the aortic valve. Aortic valve regurgitation is not visualized. Aortic valve sclerosis/calcification is present, without any evidence of aortic stenosis.  6. The inferior vena cava is normal in size with greater than 50% respiratory variability, suggesting right atrial pressure of 3 mmHg. FINDINGS  Left Ventricle: Left ventricular ejection fraction, by estimation, is 60 to 65%. The left ventricle has normal function. The left ventricle has no regional wall motion abnormalities. The left ventricular internal cavity size was normal in size. There is  no left ventricular hypertrophy. Left ventricular diastolic parameters are consistent with Grade I diastolic dysfunction (impaired relaxation). Right Ventricle: The right ventricular size is normal. No increase in right ventricular wall thickness. Right ventricular systolic function is normal. Left Atrium: Left atrial size was moderately dilated. Right Atrium: Right atrial size was normal in size. Pericardium: There is no evidence of  pericardial effusion. Mitral Valve: The mitral valve is normal in structure. Mild mitral valve regurgitation. No evidence of mitral valve stenosis. Tricuspid Valve: The tricuspid valve is normal in structure. Tricuspid valve regurgitation is trivial. No evidence of tricuspid stenosis. Aortic Valve: The aortic valve is tricuspid. There is mild calcification of the aortic valve. Aortic valve regurgitation is not visualized. Aortic valve sclerosis/calcification  is present, without any evidence of aortic stenosis. Aortic valve mean gradient measures 6.0 mmHg. Aortic valve peak gradient measures 10.2 mmHg. Aortic valve area, by VTI measures 2.36 cm. Pulmonic Valve: The pulmonic valve was normal in structure. Pulmonic valve regurgitation is not visualized. No evidence of pulmonic stenosis. Aorta: The aortic root is normal in size and structure. Venous: The inferior vena cava is normal in size with greater than 50% respiratory variability, suggesting right atrial pressure of 3 mmHg. IAS/Shunts: No atrial level shunt detected by color flow Doppler.  LEFT VENTRICLE PLAX 2D LVIDd:         5.10 cm   Diastology LVIDs:         3.80 cm   LV e' medial:    7.40 cm/s LV PW:         0.90 cm   LV E/e' medial:  10.7 LV IVS:        1.00 cm   LV e' lateral:   7.94 cm/s LVOT diam:     2.10 cm   LV E/e' lateral: 9.9 LV SV:         80 LV SV Index:   43 LVOT Area:     3.46 cm  RIGHT VENTRICLE RV Basal diam:  3.00 cm LEFT ATRIUM             Index        RIGHT ATRIUM           Index LA diam:        4.50 cm 2.42 cm/m   RA Area:     10.40 cm LA Vol (A2C):   54.9 ml 29.55 ml/m  RA Volume:   21.00 ml  11.30 ml/m LA Vol (A4C):   44.6 ml 24.00 ml/m LA Biplane Vol: 52.3 ml 28.15 ml/m  AORTIC VALVE                     PULMONIC VALVE AV Area (Vmax):    2.29 cm      PV Vmax:       1.18 m/s AV Area (Vmean):   2.31 cm      PV Peak grad:  5.6 mmHg AV Area (VTI):     2.36 cm AV Vmax:           160.00 cm/s AV Vmean:          108.000 cm/s AV VTI:            0.341 m AV Peak Grad:      10.2 mmHg AV Mean Grad:      6.0 mmHg LVOT Vmax:         106.00 cm/s LVOT Vmean:        71.900 cm/s LVOT VTI:          0.232 m LVOT/AV VTI ratio: 0.68  AORTA Ao Root diam: 2.50 cm Ao Asc diam:  3.50 cm MITRAL VALVE MV Area (PHT): 3.85 cm     SHUNTS MV Decel Time: 197 msec     Systemic VTI:  0.23 m MV E velocity: 78.90 cm/s  Systemic Diam: 2.10 cm MV A velocity: 123.00 cm/s MV E/A ratio:  0.64 Arvilla Meres MD  Electronically signed by Arvilla Meres MD Signature Date/Time: 01/20/2023/12:05:09 PM    Final    DG Foot Complete Right  Result Date: 01/19/2023 CLINICAL DATA:  Pain in right foot. EXAM: RIGHT FOOT COMPLETE - 3+ VIEW COMPARISON:  None Available. FINDINGS: There is marked diffuse soft tissue edema about the right foot. Soft tissue ulceration overlying the plantar surface of the calcaneus is identified with overlying bandage material and subcutaneous gas. No signs focal bony erosion of the calcaneus. Suspected needle fragment noted within the soft tissues along the plantar surface of the second digit at the level of the second middle phalanx Second through fifth hammertoe deformities. No signs of acute fracture. Degenerative changes are identified between the tarsal bones. IMPRESSION: 1. Soft tissue ulceration overlying the plantar surface of the calcaneus with overlying bandage material and subcutaneous gas. 2. No signs of focal bony erosion to suggest osteomyelitis. 3. Suspected needle fragment within the soft tissues along the plantar surface of the second digit at the level of the second middle phalanx. 4. Marked diffuse soft tissue edema about the right foot. 5. Second through fifth hammertoe deformities. Electronically Signed   By: Signa Kell M.D.   On: 01/19/2023 15:10   - pertinent xrays, CT, MRI studies were reviewed and independently interpreted  Positive ROS: All other systems have been reviewed and were otherwise negative with the exception of those mentioned in the HPI and as above.  Physical Exam: General: Alert, no acute distress Psychiatric: Patient is competent for consent with normal mood and affect Lymphatic: No axillary or cervical lymphadenopathy Cardiovascular: No pedal edema Respiratory: No cyanosis, no use of accessory musculature GI: No organomegaly, abdomen is soft and non-tender    Images:  @ENCIMAGES @  Labs:  Lab Results  Component Value Date   HGBA1C 8.1  (H) 01/19/2023   HGBA1C 10.2 05/15/2015   REPTSTATUS PENDING 01/19/2023   CULT  01/19/2023    NO GROWTH < 24 HOURS Performed at [facility], [address] [place], [place] [address]     Lab Results  Component Value Date   ALBUMIN 2.3 (L) 01/20/2023   ALBUMIN 3.8 05/09/2020        Latest Ref Rng & Units 01/20/2023    9:11 AM 01/19/2023    2:28 PM 05/09/2020    3:59 PM  CBC EXTENDED  WBC 4.0 - 10.5 K/uL 7.4  7.0  7.9   RBC 3.87 - 5.11 MIL/uL 3.65  3.32  4.00   Hemoglobin 12.0 - 15.0 g/dL 19.1  9.7  47.8   HCT 29.5 - 46.0 % 33.1  31.0  38.0   Platelets 150 - 400 K/uL 395  440  343   NEUT# 1.7 - 7.7 K/uL  4.8  5.9   Lymph# 0.7 - 4.0 K/uL  1.1  1.1     Neurologic: Patient does not have protective sensation bilateral lower extremities.   MUSCULOSKELETAL:   Skin: Bilateral legs with brawney edema with thick hard skin changes, there is a necrotic ulcer right heel with necrotic changes 5 cm in diameter, with necrosis down to bone  Cannot palpate pulses bilaterally  Assessment: Lymphedema with gangrenous ulcer right heel.  Plan: Surgical option is a right AKA, she said she would rather die that have an amputation. She is very alert and oriented.  Thank you for the consult and the opportunity to see Amber Dudley  Aldean Baker, MD [facility] [phone] 3:16 PM

## 2023-01-20 NOTE — Progress Notes (Signed)
Subjective: Amber Dudley is a pleasant 89 year old woman with a past medical history of HTN, Insulin dependent T2DM, Hypothyroidism, and chronic lower extremity edema who is admitted for further evaluation and treatment of a right calcaneal subacute wet gangrenous diabetic ulcer.    Today, she reported feeling fair. She endorses pain when her right foot is resting on the bed but denies any other complaints. She is anxious and reported feeling worried about the possibility of an amputation. She also stated that she would rather not lose her ability to be independent with a surgery and is ok with her life as it is.   Objective:  Vital signs in last 24 hours: Vitals:   01/19/23 1943 01/19/23 2014 01/20/23 0525 01/20/23 0753  BP: (!) 176/88  (!) 150/80 (!) 162/96  Pulse: 96  (!) 103 (!) 110  Resp: 20  20 17   Temp: 97.6 F (36.4 C)  98.7 F (37.1 C) 97.6 F (36.4 C)  TempSrc: Oral  Oral Oral  SpO2: 94%  95% 97%  Weight: 82 kg 82.6 kg    Height: 5\' 3"  (1.6 m)      PE: General: resting comfortably in bed, NAD Cardiac:RRR, no murmurs, PACs on EKG Pulmonary:clear bilaterally, normal work of breathing observed  ZOX:[id number] ulcer   Neuro: alert Psych: normal mood, anxious     Latest Ref Rng & Units 01/20/2023    9:11 AM 01/19/2023    2:28 PM 05/09/2020    3:59 PM  CBC  WBC 4.0 - 10.5 K/uL 7.4  7.0  7.9   Hemoglobin 12.0 - 15.0 g/dL 11.9  9.7  14.7   Hematocrit 36.0 - 46.0 % 33.1  31.0  38.0   Platelets 150 - 400 K/uL 395  440  343        Latest Ref Rng & Units 01/19/2023    2:28 PM 05/09/2020    3:59 PM 04/21/2020    4:10 PM  CMP  Glucose 70 - 99 mg/dL 829  562  76   BUN 8 - 23 mg/dL 11  12  18    Creatinine 0.44 - 1.00 mg/dL 1.30  8.65  7.84   Sodium 135 - 145 mmol/L 138  139  139   Potassium 3.5 - 5.1 mmol/L 3.5  3.4  3.8   Chloride 98 - 111 mmol/L 103  103  101   CO2 22 - 32 mmol/L 27  26  28    Calcium 8.9 - 10.3 mg/dL 8.4  8.9  8.9   Total Protein 6.5 - 8.1 g/dL   8.8    Total Bilirubin 0.3 - 1.2 mg/dL  0.5    Alkaline Phos 38 - 126 U/L  102    AST 15 - 41 U/L  25    ALT 0 - 44 U/L  22      Assessment/Plan:  Principal Problem:   Diabetic wet gangrene of the foot (HCC) Active Problems:   Hypothyroidism  Right diabetic ulcer with right wet gangrene Possible underlying chronic lymphedema  -Orthopedics consulted, will follow up with recommendations: possible debridement today vs amputation?  -Will follow up on orthopedic recommendations  -MRI ordered to evaluate extent of infection  -Wound care consulted, recommends Gerhardts and eucerin cream  -Continue IV vancomycin and zosyn  -NPO -ABI scheduled for today to determine vascular supply   PACs, first degree heart block -First degree heart block is most likely from use of Beta blocker -No IRIR on EKG from yesterday evening -  Echo scheduled for this afternoon -replaced mag -will check K+ with RFP and supplement as needed   T2DM -A1c: 8.1% -Continue SSI -Hold home metformin  Hypothyroidism -TSH: 8.051 -Begin 68.5 of levothyroxine  HTN -Amlodipine 5mg  -Lopressor increased to 100mg    AKI?  -most previous cr from 2 years ago was 0.8 -Cr 1.19 on yesterdays labs -RFP is pending, will continue to hold losartan and lasix in setting of possible AKI     Prior to Admission Living Arrangement: Home Anticipated Discharge Location:SNF Barriers to Discharge:Medical treatment  Dispo: Anticipated discharge in approximately 2-3 day(s).   Faith Rogue, DO 01/20/2023, 8:12 AM Pager: [phone] After 5pm on weekdays and 1pm on weekends: On Call pager [phone]

## 2023-01-20 NOTE — Consult Note (Addendum)
WOC Nurse Consult Note: Reason for Consult: Consult requested for buttocks and bilat legs.   Progress notes indicate ortho consult is pending for necrotic right heel full thickness wound and possible debridement; please refer to their team for further plan of care.  Wound type: Buttocks with intact skin, red and macerated and painful around anus with significant hemorrhoids. Bilat legs with generalized lymphedema and chronic peau 'de orange skin changes.  Dressing procedure/placement/frequency: Topical treatment orders provided for bedside nurses to perform as follows: Apply Eucerin cream to bilat legs Q day. Apply Gerhardts cream to buttocks BID and PRN soiling Please re-consult if further assistance is needed.  Thank-you,  Cammie Mcgee MSN, RN, CWOCN, Amber Dudley, CNS [phone]

## 2023-01-20 NOTE — Evaluation (Signed)
Occupational Therapy Evaluation Patient Details Name: Amber Dudley MRN: [id number] DOB: 1931/02/07 Today's Date: 01/20/2023   History of Present Illness 89 year old woman presents to ED 7/18 after hitting R foot causing increased bleeding 2 days prior and onset of increased pain in weight bearing on R foot. Reports no lack of sensation or neuropathy but is unaware of needle embedded in R 5th metatarsal. Found to have chronic diabetic R foot ulcer. Xrays reveal soft tissue ulceration overlying plantar surface of calcaneus and subcutaneous gas but no focal bony erosions. Admitted for observation and development of plan of care. PMH:HTN, Insulin dependent T2DM, Hypothyroidism, and chronic lower extremity edema   Clinical Impression   Pt admitted with the above symptoms. Pt currently with functional limitations due to the deficits listed below (see OT Problem List). Prior to admit, pt  was living alone at home independent/mod I with all BADL tasks and functional mobility up until 2 weeks ago when she started using a RW as it became more difficult to get around.  Pt will benefit from acute skilled OT to increase their safety and independence with ADL and functional mobility for ADL to facilitate discharge. Patient will benefit from continued inpatient follow up therapy, <3 hours/day. OT will continue to follow patient acutely.         Recommendations for follow up therapy are one component of a multi-disciplinary discharge planning process, led by the attending physician.  Recommendations may be updated based on patient status, additional functional criteria and insurance authorization.   Assistance Recommended at Discharge Set up Supervision/Assistance  Patient can return home with the following A little help with walking and/or transfers;A little help with bathing/dressing/bathroom;Help with stairs or ramp for entrance;Assist for transportation    Functional Status Assessment  Patient has had a  recent decline in their functional status and demonstrates the ability to make significant improvements in function in a reasonable and predictable amount of time.  Equipment Recommendations  BSC/3in1       Precautions / Restrictions Precautions Precautions: Fall Precaution Comments: fall 2 year prior Restrictions Weight Bearing Restrictions: No      Mobility Bed Mobility Overal bed mobility: Needs Assistance Bed Mobility: Supine to Sit, Sit to Supine     Supine to sit: Supervision, HOB elevated Sit to supine: Supervision   General bed mobility comments: Pt was able to complete bed mobility with increased difficulty although no physical assist provided. Pt reports that she will either sleep in her lift recliner or regular bed.    Transfers Overall transfer level: Needs assistance Equipment used: None Transfers: Sit to/from Stand, Bed to chair/wheelchair/BSC Sit to Stand: Supervision     Step pivot transfers: Supervision     General transfer comment: Pt reports that she normally reaches and pulls herself up into standing at home. Did push off of surface with one hand on BSC armrest and bed rail while using momentum to power up.      Balance Overall balance assessment: Needs assistance Sitting-balance support: Feet supported, No upper extremity supported Sitting balance-Leahy Scale: Good Sitting balance - Comments: sitting EOB while performing sock management     Standing balance-Leahy Scale: Poor Standing balance comment: Pt required use of at least 1 UE for light support for standing balance.        ADL either performed or assessed with clinical judgement   ADL Overall ADL's : Needs assistance/impaired     Grooming: Wash/dry hands;Set up;Sitting  Upper Body Dressing : Set up;Sitting   Lower Body Dressing: Supervision/safety;Sitting/lateral leans Lower Body Dressing Details (indicate cue type and reason): Pt able to don/doff large blue non-skid  socks while seated EOB. Reports that she wears hose at home with her shoes. Toilet Transfer: Supervision/safety;Stand-pivot;BSC/3in1   Psychiatrist and Hygiene: Set up;Sit to/from stand               Vision Baseline Vision/History: 0 No visual deficits Ability to See in Adequate Light: 1 Impaired Patient Visual Report: Other (comment) (reports larger floaters noticed in her right eye only) Vision Assessment?: No apparent visual deficits            Pertinent Vitals/Pain Pain Assessment Pain Assessment: No/denies pain (at rest in bed. Pt states that she will have pain if she moves her right leg wrong.)     Hand Dominance Right   Extremity/Trunk Assessment Upper Extremity Assessment Upper Extremity Assessment: RUE deficits/detail;LUE deficits/detail RUE Deficits / Details: history of shoulder dislocation with some baseline deficits per patient report. WFL A/ROM. MMT: shoulder flexion: 3+/5, shoulder abduction: 4-/5, functional gross grasp. LUE Deficits / Details: WFL A/ROM and strength   Lower Extremity Assessment Lower Extremity Assessment: RLE deficits/detail;LLE deficits/detail RLE Deficits / Details: R heel ulcer, lymphedema/chronic swelling of LE below knee limiting anlke ROM.  LLE Deficits / Details: lymphedema/chronic swelling of LE below knee limiting ankle ROM.    Cervical / Trunk Assessment Cervical / Trunk Assessment: Kyphotic   Communication Communication Communication: No difficulties   Cognition Arousal/Alertness: Awake/alert Behavior During Therapy: WFL for tasks assessed/performed Overall Cognitive Status: Impaired/Different from baseline Area of Impairment: Safety/judgement          Safety/Judgement: Decreased awareness of deficits           General Comments  VSS on RA            Home Living Family/patient expects to be discharged to:: Private residence Living Arrangements: Alone Available Help at Discharge:  Available PRN/intermittently (Grandson lives across the street and checks in on pt intermittently.) Type of Home: House Home Access: Level entry;Ramped entrance     Home Layout: One level     Bathroom Shower/Tub: Chief Strategy Officer: Handicapped height Bathroom Accessibility: No   Home Equipment: Agricultural consultant (2 wheels);Rollator (4 wheels);Standard Walker;Cane - single point;Shower seat;Wheelchair - manual;Grab bars - tub/shower          Prior Functioning/Environment Prior Level of Function : Independent/Modified Independent             Mobility Comments: 2 weeks prior walking without AD last 2 weeks using a walker ADLs Comments: independent, but then relates family has been getting her groceries        OT Problem List: Decreased strength;Decreased activity tolerance;Impaired balance (sitting and/or standing)      OT Treatment/Interventions: Self-care/ADL training;Therapeutic exercise;Patient/family education;Balance training;Energy conservation;Therapeutic activities;DME and/or AE instruction    OT Goals(Current goals can be found in the care plan section) Acute Rehab OT Goals Patient Stated Goal: to go to the bathroom OT Goal Formulation: Patient unable to participate in goal setting Time For Goal Achievement: 02/03/23 Potential to Achieve Goals: Good  OT Frequency: Min 1X/week       AM-PAC OT "6 Clicks" Daily Activity     Outcome Measure Help from another person eating meals?: None Help from another person taking care of personal grooming?: A Little Help from another person toileting, which includes using toliet, bedpan, or urinal?: A Little Help  from another person bathing (including washing, rinsing, drying)?: A Little Help from another person to put on and taking off regular upper body clothing?: A Little Help from another person to put on and taking off regular lower body clothing?: A Little 6 Click Score: 19   End of Session    Activity  Tolerance: Patient tolerated treatment well Patient left: in bed;with call bell/phone within reach  OT Visit Diagnosis: Muscle weakness (generalized) (M62.81)                Time: 5427-0623 OT Time Calculation (min): 37 min Charges:  OT General Charges $OT Visit: 1 Visit OT Evaluation $OT Eval Moderate Complexity: 1 Mod OT Treatments $Self Care/Home Management : 8-22 mins  Limmie Patricia, OTR/L,CBIS  Supplemental OT - [facility] and [facility] Secure Chat Preferred    , Charisse March 01/20/2023, 1:48 PM

## 2023-01-20 NOTE — Evaluation (Signed)
Physical Therapy Evaluation Patient Details Name: Amber Dudley MRN: [id number] DOB: 03/06/1931 Today's Date: 01/20/2023  History of Present Illness  89 year old woman presents to ED 7/18 after hitting R foot causing increased bleeding 2 days prior and onset of increased pain in weight bearing on R foot. Reports no lack of sensation or neuropathy but is unaware of needle embedded in R 5th metatarsal. Found to have chronic diabetic R foot ulcer. Xrays reveal soft tissue ulceration overlying plantar surface of calcaneus and subcutaneous gas but no focal bony erosions. Admitted for observation and development of plan of care. PMH:HTN, Insulin dependent T2DM, Hypothyroidism, and chronic lower extremity edema  Clinical Impression  PTA pt living alone in single story home with level entry. Pt's grandson checks in on her daily, but up to 2 weeks ago pt was completely independent not requiring AD for ambulation. Pt reports she has been having more difficult and has been using RW to get around. Pt is limited by decreased skin integrity below bilateral knees and onset of diabetic wound on R heel, in presence of historical L knee pain and decreased ROM, and strength. Pt is min guard for bed mobility, transfers and ambulation in room, however ambulation distance limited by pain. Patient will benefit from continued inpatient follow up therapy, <3 hours/day. PT will continue to follow acutely.         Assistance Recommended at Discharge Frequent or constant Supervision/Assistance  If plan is discharge home, recommend the following:  Can travel by private vehicle  A little help with walking and/or transfers;A little help with bathing/dressing/bathroom;Assistance with cooking/housework;Assist for transportation;Help with stairs or ramp for entrance   Yes    Equipment Recommendations None recommended by PT  Recommendations for Other Services  OT consult    Functional Status Assessment Patient has had a  recent decline in their functional status and demonstrates the ability to make significant improvements in function in a reasonable and predictable amount of time.     Precautions / Restrictions Precautions Precautions: Fall Precaution Comments: fall 2 year prior Restrictions Weight Bearing Restrictions: No      Mobility  Bed Mobility Overal bed mobility: Needs Assistance Bed Mobility: Supine to Sit     Supine to sit: Supervision, HOB elevated     General bed mobility comments: supervision for coming to EoB with use of bed rail and HoB elevated    Transfers Overall transfer level: Needs assistance Equipment used: Rolling walker (2 wheels) Transfers: Sit to/from Stand Sit to Stand: Min guard           General transfer comment: attempts to come to standing with pulling on RW, PT asks for her to push up from the bed, pt able to perform at a min guard level with increased time and effort, then is able to reach up to RW    Ambulation/Gait Ambulation/Gait assistance: Min guard Gait Distance (Feet): 10 Feet Assistive device: Rolling walker (2 wheels) Gait Pattern/deviations: Decreased weight shift to right, Knee flexed in stance - right, Knee flexed in stance - left, Decreased step length - right, Decreased step length - left, Shuffle, Trunk flexed Gait velocity: slowed Gait velocity interpretation: <1.31 ft/sec, indicative of household ambulator   General Gait Details: min guard for limited ambulation in room, pt with decreased weight shift to the R and complaints of L knee pain at end of ambulation, pt with shuffling gait and decreased foot clearance      Balance Overall balance assessment: Needs assistance  Sitting-balance support: Feet supported, No upper extremity supported Sitting balance-Leahy Scale: Fair     Standing balance support: No upper extremity supported, During functional activity, Reliant on assistive device for balance Standing balance-Leahy Scale:  Poor Standing balance comment: can static stand without AD but requires B UE support for dynamic balance                             Pertinent Vitals/Pain Pain Assessment Pain Assessment: No/denies pain    Home Living Family/patient expects to be discharged to:: Private residence Living Arrangements: Alone Available Help at Discharge: Available PRN/intermittently (grandson lives across the street) Type of Home: House Home Access: Level entry;Ramped entrance       Home Layout: One level Home Equipment: Agricultural consultant (2 wheels);Rollator (4 wheels);Standard Walker;Cane - single point;Shower seat;Wheelchair - manual      Prior Function Prior Level of Function : Independent/Modified Independent             Mobility Comments: 2 weeks prior walking without AD last 2 weeks using a walker ADLs Comments: independent, but then relates family has been getting her groceries        Extremity/Trunk Assessment   Upper Extremity Assessment Upper Extremity Assessment: Overall WFL for tasks assessed    Lower Extremity Assessment Lower Extremity Assessment: RLE deficits/detail;LLE deficits/detail RLE Deficits / Details: R heel ulcer, lymphedema/chronic swelling of LE below knee limiting ankle ROM, knee pain limiting ROM, RLE Sensation: decreased light touch (reports no difference in feeling between above knee and below knee but unable to pinpoint light touch below knee) RLE Coordination: decreased fine motor LLE Deficits / Details: lymphedema/chronic swelling of LE below knee limiting ankle ROM, knee pain limiting ROM, LLE Sensation: decreased light touch (reports no difference in feeling between above knee and below knee but unable to pinpoint light touch below knee) LLE Coordination: decreased fine motor    Cervical / Trunk Assessment Cervical / Trunk Assessment: Kyphotic  Communication   Communication: No difficulties  Cognition Arousal/Alertness:  Awake/alert Behavior During Therapy: WFL for tasks assessed/performed Overall Cognitive Status: Impaired/Different from baseline Area of Impairment: Safety/judgement                         Safety/Judgement: Decreased awareness of safety     General Comments: decreased understanding of severity of her wounds        General Comments General comments (skin integrity, edema, etc.): VSS on RA     Assessment/Plan    PT Assessment Patient needs continued PT services  PT Problem List Decreased strength;Decreased range of motion;Decreased activity tolerance;Decreased balance;Decreased mobility;Decreased coordination;Decreased safety awareness;Impaired sensation;Decreased skin integrity;Pain       PT Treatment Interventions DME instruction;Gait training;Functional mobility training;Therapeutic activities;Therapeutic exercise;Balance training;Cognitive remediation;Patient/family education    PT Goals (Current goals can be found in the Care Plan section)  Acute Rehab PT Goals Patient Stated Goal: go home alone PT Goal Formulation: With patient Time For Goal Achievement: 02/03/23 Potential to Achieve Goals: Fair    Frequency Min 3X/week        AM-PAC PT "6 Clicks" Mobility  Outcome Measure Help needed turning from your back to your side while in a flat bed without using bedrails?: None Help needed moving from lying on your back to sitting on the side of a flat bed without using bedrails?: A Little Help needed moving to and from a bed to a chair (including a  wheelchair)?: A Little Help needed standing up from a chair using your arms (e.g., wheelchair or bedside chair)?: A Little Help needed to walk in hospital room?: A Little Help needed climbing 3-5 steps with a railing? : A Lot 6 Click Score: 18    End of Session Equipment Utilized During Treatment: Gait belt Activity Tolerance: Patient limited by pain;Patient tolerated treatment well Patient left: in chair;with  call bell/phone within reach;with chair alarm set Nurse Communication: Mobility status PT Visit Diagnosis: Unsteadiness on feet (R26.81);Other abnormalities of gait and mobility (R26.89);Muscle weakness (generalized) (M62.81);Difficulty in walking, not elsewhere classified (R26.2);Pain Pain - Right/Left: Right Pain - part of body: Ankle and joints of foot    Time: 0945-1010 PT Time Calculation (min) (ACUTE ONLY): 25 min   Charges:   PT Evaluation $PT Eval Moderate Complexity: 1 Mod PT Treatments $Therapeutic Activity: 8-22 mins PT General Charges $$ ACUTE PT VISIT: 1 Visit          B. Beverely Risen PT, DPT Acute Rehabilitation Services Please use secure chat or  Call Office [phone]   Elon Alas [facility] 01/20/2023, 10:40 AM

## 2023-01-21 DIAGNOSIS — H3530 Unspecified macular degeneration: Secondary | ICD-10-CM | POA: Diagnosis present

## 2023-01-21 DIAGNOSIS — Z6833 Body mass index (BMI) 33.0-33.9, adult: Secondary | ICD-10-CM | POA: Diagnosis not present

## 2023-01-21 DIAGNOSIS — E11622 Type 2 diabetes mellitus with other skin ulcer: Secondary | ICD-10-CM | POA: Diagnosis present

## 2023-01-21 DIAGNOSIS — Z8249 Family history of ischemic heart disease and other diseases of the circulatory system: Secondary | ICD-10-CM | POA: Diagnosis not present

## 2023-01-21 DIAGNOSIS — I10 Essential (primary) hypertension: Secondary | ICD-10-CM | POA: Diagnosis not present

## 2023-01-21 DIAGNOSIS — N1831 Chronic kidney disease, stage 3a: Secondary | ICD-10-CM | POA: Diagnosis present

## 2023-01-21 DIAGNOSIS — E43 Unspecified severe protein-calorie malnutrition: Secondary | ICD-10-CM | POA: Diagnosis present

## 2023-01-21 DIAGNOSIS — Z515 Encounter for palliative care: Secondary | ICD-10-CM | POA: Diagnosis not present

## 2023-01-21 DIAGNOSIS — E039 Hypothyroidism, unspecified: Secondary | ICD-10-CM | POA: Diagnosis present

## 2023-01-21 DIAGNOSIS — L97519 Non-pressure chronic ulcer of other part of right foot with unspecified severity: Secondary | ICD-10-CM | POA: Diagnosis present

## 2023-01-21 DIAGNOSIS — Z823 Family history of stroke: Secondary | ICD-10-CM | POA: Diagnosis not present

## 2023-01-21 DIAGNOSIS — Z7189 Other specified counseling: Secondary | ICD-10-CM

## 2023-01-21 DIAGNOSIS — M1990 Unspecified osteoarthritis, unspecified site: Secondary | ICD-10-CM | POA: Diagnosis present

## 2023-01-21 DIAGNOSIS — Z79899 Other long term (current) drug therapy: Secondary | ICD-10-CM | POA: Diagnosis not present

## 2023-01-21 DIAGNOSIS — L97419 Non-pressure chronic ulcer of right heel and midfoot with unspecified severity: Secondary | ICD-10-CM | POA: Diagnosis present

## 2023-01-21 DIAGNOSIS — E11628 Type 2 diabetes mellitus with other skin complications: Secondary | ICD-10-CM | POA: Diagnosis not present

## 2023-01-21 DIAGNOSIS — E1122 Type 2 diabetes mellitus with diabetic chronic kidney disease: Secondary | ICD-10-CM | POA: Diagnosis present

## 2023-01-21 DIAGNOSIS — E785 Hyperlipidemia, unspecified: Secondary | ICD-10-CM | POA: Diagnosis present

## 2023-01-21 DIAGNOSIS — E1142 Type 2 diabetes mellitus with diabetic polyneuropathy: Secondary | ICD-10-CM | POA: Diagnosis present

## 2023-01-21 DIAGNOSIS — E1152 Type 2 diabetes mellitus with diabetic peripheral angiopathy with gangrene: Principal | ICD-10-CM

## 2023-01-21 DIAGNOSIS — L089 Local infection of the skin and subcutaneous tissue, unspecified: Secondary | ICD-10-CM | POA: Diagnosis not present

## 2023-01-21 DIAGNOSIS — Z794 Long term (current) use of insulin: Secondary | ICD-10-CM | POA: Diagnosis not present

## 2023-01-21 DIAGNOSIS — E1165 Type 2 diabetes mellitus with hyperglycemia: Secondary | ICD-10-CM | POA: Diagnosis present

## 2023-01-21 DIAGNOSIS — Z66 Do not resuscitate: Secondary | ICD-10-CM | POA: Diagnosis not present

## 2023-01-21 DIAGNOSIS — I440 Atrioventricular block, first degree: Secondary | ICD-10-CM | POA: Diagnosis present

## 2023-01-21 DIAGNOSIS — E11621 Type 2 diabetes mellitus with foot ulcer: Secondary | ICD-10-CM | POA: Diagnosis present

## 2023-01-21 DIAGNOSIS — E1140 Type 2 diabetes mellitus with diabetic neuropathy, unspecified: Secondary | ICD-10-CM | POA: Diagnosis present

## 2023-01-21 DIAGNOSIS — Z79890 Hormone replacement therapy (postmenopausal): Secondary | ICD-10-CM | POA: Diagnosis not present

## 2023-01-21 DIAGNOSIS — I129 Hypertensive chronic kidney disease with stage 1 through stage 4 chronic kidney disease, or unspecified chronic kidney disease: Secondary | ICD-10-CM | POA: Diagnosis present

## 2023-01-21 LAB — RENAL FUNCTION PANEL
Albumin: 2.3 g/dL — ABNORMAL LOW (ref 3.5–5.0)
Anion gap: 11 (ref 5–15)
BUN: 12 mg/dL (ref 8–23)
CO2: 26 mmol/L (ref 22–32)
Calcium: 8.3 mg/dL — ABNORMAL LOW (ref 8.9–10.3)
Chloride: 102 mmol/L (ref 98–111)
Creatinine, Ser: 1.01 mg/dL — ABNORMAL HIGH (ref 0.44–1.00)
GFR, Estimated: 53 mL/min — ABNORMAL LOW (ref >60)
Glucose, Bld: 170 mg/dL — ABNORMAL HIGH (ref 70–99)
Phosphorus: 3.5 mg/dL (ref 2.5–4.6)
Potassium: 3.3 mmol/L — ABNORMAL LOW (ref 3.5–5.1)
Sodium: 139 mmol/L (ref 135–145)

## 2023-01-21 LAB — GLUCOSE, CAPILLARY
Glucose-Capillary: 168 mg/dL — ABNORMAL HIGH (ref 70–99)
Glucose-Capillary: 256 mg/dL — ABNORMAL HIGH (ref 70–99)
Glucose-Capillary: 233 mg/dL — ABNORMAL HIGH (ref 70–99)
Glucose-Capillary: 126 mg/dL — ABNORMAL HIGH (ref 70–99)

## 2023-01-21 LAB — CBC
HCT: 32 % — ABNORMAL LOW (ref 36.0–46.0)
Hemoglobin: 10.1 g/dL — ABNORMAL LOW (ref 12.0–15.0)
MCH: 29.1 pg (ref 26.0–34.0)
MCHC: 31.6 g/dL (ref 30.0–36.0)
MCV: 92.2 fL (ref 80.0–100.0)
Platelets: 474 10*3/uL — ABNORMAL HIGH (ref 150–400)
RBC: 3.47 MIL/uL — ABNORMAL LOW (ref 3.87–5.11)
RDW: 15 % (ref 11.5–15.5)
WBC: 7.7 10*3/uL (ref 4.0–10.5)
nRBC: 0 % (ref 0.0–0.2)

## 2023-01-21 LAB — CULTURE, BLOOD (ROUTINE X 2): Culture: NO GROWTH

## 2023-01-21 MED ORDER — ACETAMINOPHEN 500 MG PO TABS
1000.0000 mg | ORAL_TABLET | Freq: Three times a day (TID) | ORAL | Status: DC
  Administered 2023-01-21 – 2023-01-31 (×30): 1000 mg via ORAL
  Filled 2023-01-21 (×30): qty 2

## 2023-01-21 MED ORDER — AMLODIPINE BESYLATE 10 MG PO TABS
10.0000 mg | ORAL_TABLET | Freq: Every day | ORAL | Status: DC
  Administered 2023-01-21 – 2023-01-31 (×11): 10 mg via ORAL
  Filled 2023-01-21 (×11): qty 1

## 2023-01-21 MED ORDER — DICLOFENAC SODIUM 1 % EX GEL
4.0000 g | Freq: Four times a day (QID) | CUTANEOUS | Status: DC
  Administered 2023-01-21 – 2023-01-27 (×16): 4 g via TOPICAL
  Filled 2023-01-21: qty 100

## 2023-01-21 MED ORDER — LIDOCAINE 5 % EX PTCH
1.0000 | MEDICATED_PATCH | CUTANEOUS | Status: DC
  Administered 2023-01-21 – 2023-01-26 (×6): 1 via TRANSDERMAL
  Filled 2023-01-21 (×6): qty 1

## 2023-01-21 NOTE — TOC Initial Note (Signed)
Transition of Care [facility]) - Initial/Assessment Note    Patient Details  Name: Amber Dudley MRN: [id number] Date of Birth: [birth year]  Transition of Care [facility]) CM/SW Contact:    Donnalee Curry, LCSWA Phone Number: 01/21/2023, 12:44 PM  Clinical Narrative:                  SW informed by NP, pt has questions regarding rehab if pursues surgery.  SW met with pt at bedside. Pt confirmed demographics. Pt reports has secondary coverage through [facility], which is not currently listed. Pt lives alone in single level home, grandson Nida Boatman lives across the street. Son [place], lives in [place]. Pt reports she has r/w, rollator, shower chair and w/c. Pt reports hx at SNF [facility] in [place]), she was there for about 4 months, 8 years ago. SW explained current obs status and insurance likely not to cover rehab at this time. If pt decides to pursue surgery, will require 3 night inpatient hospital stay for rehab services. SW also provided handout with this information. Pt reports she is planning to speak to grandson and son today to see if it surgery is worth it at her age and what the recovery will look like. Pt also interested in CIR if pursues surgery. Pt given list of SNFs to review as well.   Expected Discharge Plan: Skilled Nursing Facility Barriers to Discharge: Continued Medical Work up   Patient Goals and CMS Choice Patient states their goals for this hospitalization and ongoing recovery are:: return home CMS Medicare.gov Compare Post Acute Care list provided to:: Patient Choice offered to / list presented to : Patient [place] ownership interest in [facility].provided to:: Patient    Expected Discharge Plan and Services       Living arrangements for the past 2 months: Single Family Home                                      Prior Living Arrangements/Services Living arrangements for the past 2 months: Single Family Home Lives with:: Self Patient language and need for  interpreter reviewed:: Yes Do you feel safe going back to the place where you live?: Yes      Need for Family Participation in Patient Care: No (Comment) Care giver support system in place?: Yes (comment) Current home services: DME Criminal Activity/Legal Involvement Pertinent to Current Situation/Hospitalization: No - Comment as needed  Activities of Daily Living      Permission Sought/Granted Permission sought to share information with : Facility Industrial/product designer granted to share information with : Yes, Verbal Permission Granted  Share Information with NAME: SNF           Emotional Assessment Appearance:: Appears stated age Attitude/Demeanor/Rapport: Engaged Affect (typically observed): Accepting Orientation: : Oriented to Self, Oriented to Place, Oriented to  Time, Oriented to Situation      Admission diagnosis:  Diabetic infection of right foot (HCC) [X91.478, L08.9] Diabetic wet gangrene of the foot [facility]) [E11.52] Patient Active Problem List   Diagnosis Date Noted   Hypothyroidism 01/20/2023   Uncontrolled type 2 diabetes mellitus with hyperglycemia (HCC) 01/20/2023   Diabetic infection of right foot (HCC) 01/20/2023   Severe protein-calorie malnutrition (HCC) 01/20/2023   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 [place] [address] Phone: [phone] Fax: [phone]  [facility] [phone] [place], [place] - [address] AT [place] [facility] [place] [address] [place] [address] Phone: [phone] Fax: [phone]     Social Determinants of Health (SDOH) Social History: SDOH Screenings   Tobacco Use: Low Risk  (01/19/2023)   SDOH  Interventions:     Readmission Risk Interventions     No data to display
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
## 2023-01-21 NOTE — Consult Note (Signed)
Palliative Care Consult Note                                  Date: 01/21/2023   Patient Name: Amber Dudley  DOB: 1931-01-15  MRN: [id number]  Age / Sex: 89 y.o., female  PCP: Garald Braver Referring Physician: Ginnie Smart, MD  Reason for Consultation: Establishing goals of care  HPI/Patient Profile: 89 y.o. female  with past medical history of diabetic neuropathy, HTN, Insulin dependent T2DM, Hypothyroidism, and chronic lower extremity edema admitted on 01/19/2023 for further evaluation and treatment of a right calcaneal subacute wet gangrenous diabetic ulcer.   Today the patient reports pain in her neck, knees, and foot. She has a lot on her mind as she contemplates the possibility of an amputation.  Past Medical History:  Diagnosis Date   Diabetes mellitus, type II (HCC)    Hyperlipidemia    Hypertension    Hypothyroidism     Subjective:   I have reviewed medical records including EPIC notes, labs and imaging, spoke with the physician and RN, assessed the patient and then met with the patient in her room to discuss diagnosis prognosis, GOC, EOL wishes, disposition and options.  I introduced Palliative Medicine as specialized medical care for people living with serious illness. It focuses on providing relief from symptoms and stress of a serious illness. The goal is to improve quality of life for both the patient and the family.  Patient/Family Understanding of Illness: The patient has a good understanding of the seriousness of her illness. She has difficulty understanding the impact this illness or an amputation would have on her living situation.  Social History: The patient grew up in [place]. She is a widow. She has two living children [name] (in [place]) and [name] in [place]. She has many grandchildren and great grandchildren. When she worked she worked for [employer]. She volunteered for several years at  [facility]  Functional and Nutritional State: The patient lived at home independently before admission. She reports she was doing "pretty well" and up until a few days before admission was able to go to the store.   Palliative Symptoms: OA pain in both knees, pain in foot, and neck soreness (due to sleeping). She usually takes ibuprofen.  Today's Discussion  With patient: The patient needs time to consider her options. She considers herself a homebody and she gets enjoyment from indoor activities such as watching tv and doing puzzles. She is very independent and would find it unacceptable to be a burden to her family. She is also taking into consideration how much each of her options will cost--- I messaged SW to speak with her regarding this.   With son (40 minutes): The son states the patient has not been doing well living on her own. The patient is a Chartered loss adjuster and her home is often in disrepair--- she lived a year without hot water and has a significant mold problem in her basement. In the past the family has offered to help her but she has declined. He is very concerned. He understands she cannot go back into that home.   Discussed the importance of continued conversation with family and the medical providers regarding overall plan of care and treatment options, ensuring decisions are within the context of the patient's values and GOCs.  Questions and concerns were addressed. The patient was encouraged to call with  questions or concerns. PMT will continue to support holistically.  Review of Systems  Cardiovascular:  Positive for leg swelling.  Musculoskeletal:  Positive for arthralgias, neck pain and neck stiffness.  Skin:  Positive for wound.  All other systems reviewed and are negative.   Objective:   Primary Diagnoses: Present on Admission:  Diabetic wet gangrene of the foot [facility])   Physical Exam Constitutional:      General: She is awake.  HENT:     Head:  Normocephalic and atraumatic.  Pulmonary:     Effort: Pulmonary effort is normal.  Musculoskeletal:     Right lower leg: Edema present.     Left lower leg: Edema present.     Comments: Redness on lower legs  Skin:    Findings: Erythema present.  Neurological:     Mental Status: She is alert.     Vital Signs:  BP (!) 152/69 (BP Location: Left Arm)   Pulse 86   Temp 98 F (36.7 C) (Oral)   Resp 18   Ht 5\' 3"  (1.6 m)   Wt 82.6 kg   SpO2 94%   BMI 32.26 kg/m   Palliative Assessment/Data: 50% prior to admission    Advanced Care Planning:   Existing Vynca/ACP Documentation: None  Primary Decision Maker: The patient is the primary decision maker. If she were unable to make a decision she would want her son [name] [phone] to be her healthcare proxy.  Code Status/Advance Care Planning: Full code. Patient did not want to make any code status decisions today.   Assessment & Plan:   SUMMARY OF RECOMMENDATIONS   Full code GOC family meeting 01/22/2023 at 1200 pm with son [name] to get everybody on the same page. Could increase tylenol to 1000mg  q8hr Continued PMT support Messaged CM/SW regarding her concerns about insurance coverage   Prognosis:  Unable to determine  Discharge Planning:  To Be Determined   Discussed with: Dr. Morene Crocker, SW, CM, and RN    Thank you for allowing [place] to participate in the care of [name] PMT will continue to support holistically.  Time Total: 120 minutes    Signed by: Sarina Ser, NP Palliative Medicine Team  Team Phone # [phone] (Nights/Weekends)  01/21/2023, 10:54 AM

## 2023-01-21 NOTE — Plan of Care (Signed)
  Problem: Coping: Goal: Ability to adjust to condition or change in health will improve Outcome: Progressing   Problem: Fluid Volume: Goal: Ability to maintain a balanced intake and output will improve Outcome: Progressing   Problem: Metabolic: Goal: Ability to maintain appropriate glucose levels will improve Outcome: Progressing   Problem: Nutritional: Goal: Maintenance of adequate nutrition will improve Outcome: Progressing   Problem: Skin Integrity: Goal: Risk for impaired skin integrity will decrease Outcome: Progressing   Problem: Clinical Measurements: Goal: Ability to maintain clinical measurements within normal limits will improve Outcome: Progressing Goal: Respiratory complications will improve Outcome: Progressing   Problem: Nutrition: Goal: Adequate nutrition will be maintained Outcome: Progressing   Problem: Coping: Goal: Level of anxiety will decrease Outcome: Progressing   Problem: Elimination: Goal: Will not experience complications related to bowel motility Outcome: Progressing Goal: Will not experience complications related to urinary retention Outcome: Progressing   Problem: Pain Managment: Goal: General experience of comfort will improve Outcome: Progressing   Problem: Safety: Goal: Ability to remain free from injury will improve Outcome: Progressing   Problem: Skin Integrity: Goal: Risk for impaired skin integrity will decrease Outcome: Progressing

## 2023-01-21 NOTE — Progress Notes (Signed)
Hospital day#0 Subjective:   Summary: Amber Dudley is a 89 yo person living with a history of HTN, HLD, T2DM with severe lower extremity neuropathy,lymphedema, peripheral vascular disease, macular degeneration, hypothyroidism, independent living prior to admission admitted to the hospital for a R heel necrotic diabetic foot ulcer now on hospital day 2.  Overnight Events: None   Patient seen at bedside and patient was in consulation with Palliative care. Continues to endorse R foot pain and bilateral knee pain. No fever, nausea, chills. She is tolerating PO intake. Discussed her RLE wound and need for source control; she continues to have conversations with family but is considering what life with an amputation may look like. She hopes to engage her son and grandson in conversation with Palliative care today.   Objective:  Vital signs in last 24 hours: Vitals:   01/20/23 0753 01/20/23 1342 01/20/23 1931 01/21/23 0421  BP: (!) 162/96 (!) 151/60 139/68 (!) 179/78  Pulse: (!) 110 74 82 90  Resp: 17 17 15 17   Temp: 97.6 F (36.4 C) 97.7 F (36.5 C) 98 F (36.7 C) 98.4 F (36.9 C)  TempSrc: Oral Oral Oral   SpO2: 97% 96% 94% 92%  Weight:      Height:       Supplemental O2: Room Air SpO2: 92 % Filed Weights   01/19/23 1415 01/19/23 1943 01/19/23 2014  Weight: 86.2 kg 82 kg 82.6 kg    Physical Exam:  Constitutional:well-appearing elderly woman laying in bed HENT: normocephalic atraumatic, moist mucous membranes Neck: supple Cardiovascular: regular rate and rhythm, no m/r/g Pulmonary/Chest: normal work of breathing on room air, lungs clear to auscultation bilaterally. No wheezing Abdominal: normal BS, soft, non-tender, non-distended.  MSK: Lymphedema in lower extremities Neurological: alert & oriented x 3, 5/5 strength in bilateral upper and lower extremities, normal gait Skin: bilateral lower extremity with thickened skin/papillomatous lymphedema, hemosiderin skin changes to  the knee. Large R calcaneal wound with necrosis. Malodorous wound with purulent drainage.  Psych: Pleasant mood and affect    Intake/Output Summary (Last 24 hours) at 01/21/2023 0546 Last data filed at 01/20/2023 1149 Gross per 24 hour  Intake 100 ml  Output 101 ml  Net -1 ml   Net IO Since Admission: -1 mL [01/21/23 0546]  Pertinent Labs:    Latest Ref Rng & Units 01/20/2023    9:11 AM 01/19/2023    2:28 PM 05/09/2020    3:59 PM  CBC  WBC 4.0 - 10.5 K/uL 7.4  7.0  7.9   Hemoglobin 12.0 - 15.0 g/dL 28.3  9.7  15.1   Hematocrit 36.0 - 46.0 % 33.1  31.0  38.0   Platelets 150 - 400 K/uL 395  440  343        Latest Ref Rng & Units 01/20/2023    9:11 AM 01/19/2023    2:28 PM 05/09/2020    3:59 PM  CMP  Glucose 70 - 99 mg/dL 761  607  371   BUN 8 - 23 mg/dL 12  11  12    Creatinine 0.44 - 1.00 mg/dL 0.62  6.94  8.54   Sodium 135 - 145 mmol/L 142  138  139   Potassium 3.5 - 5.1 mmol/L 3.8  3.5  3.4   Chloride 98 - 111 mmol/L 107  103  103   CO2 22 - 32 mmol/L 27  27  26    Calcium 8.9 - 10.3 mg/dL 8.5  8.4  8.9   Total  Protein 6.5 - 8.1 g/dL   8.8   Total Bilirubin 0.3 - 1.2 mg/dL   0.5   Alkaline Phos 38 - 126 U/L   102   AST 15 - 41 U/L   25   ALT 0 - 44 U/L   22    Blood cultures: NGTD   Imaging: MR FOOT RIGHT W WO CONTRAST  Result Date: 01/20/2023 CLINICAL DATA:  Diabetic foot swelling, gangrene, foot wound. EXAM: MRI OF THE RIGHT FOOT WITHOUT AND WITH CONTRAST TECHNIQUE: Multiplanar, multisequence MR imaging of the right foot was performed before and after the administration of intravenous contrast. Osteomyelitis protocol MRI of the foot was obtained, to include the entire foot and ankle. This protocol uses a large field of view to cover the entire foot and ankle, and is suitable for assessing bony structures for osteomyelitis. Due to the large field of view and imaging plane choice, this protocol is less sensitive for assessing small structures such as ligamentous structures  of the foot and ankle, compared to a dedicated forefoot or dedicated hindfoot exam. CONTRAST:  8mL GADAVIST GADOBUTROL 1 MMOL/ML IV SOLN COMPARISON:  Radiographs 01/19/2023 FINDINGS: Despite efforts by the technologist and patient, motion artifact is present on today's exam and could not be eliminated. This reduces exam sensitivity and specificity. Bones/Joint/Cartilage Metal artifact in the second toe likely related to the small needle shown on conventional radiography. Scattered degenerative findings in the hindfoot and midfoot. Degenerative subcortical cystic lesion along the base of the fourth metatarsal. Likely reactive endosteal edema and enhancement along the posterior plantar margin of the calcaneus. This is in proximity to severe ulceration, but is less than I would expect in the setting of true osteomyelitis. Degenerative findings along the articulation of the navicular with the medial cuneiform. Ligaments Poorly characterized on today's large field of view examination. Muscles and Tendons Hypoenhancing ulceration along the plantar foot extends up to involve the medial band of the plantar fascia on image 19 series 7. Distal Achilles tendon intact. No other specific tendon abnormality observed. Generalized low-level muscular edema the foot, probably neurogenic. Soft tissues Large plantar heel ulceration with internal gas and hypoenhancement measuring about 3.0 by 4.5 by 4.2 cm. This appears to be draining to the cutaneous surface, and extends to involve the medial band of the plantar fascia with subtle reactive edema in the endosteal region of the left posteroinferior calcaneus. Suspected second small focus of ulceration along the lateral plantar foot on image 9 series 11, about the level of the Lisfranc joint. Metal artifact in the second toe likely related to the known needle shaped foreign body. IMPRESSION: 1. Large plantar heel ulceration with internal gas and hypoenhancement measuring about 3.0 by 4.5  by 4.2 cm. This appears to be draining to the cutaneous surface, and extends to involve the medial band of the plantar fascia with subtle reactive edema in the endosteal region of the left posteroinferior calcaneus. 2. Suspected second small focus of ulceration along the lateral plantar foot about the level of the Lisfranc joint. 3. Metal artifact in the second toe likely related to the known needle shaped foreign body shown on conventional radiography. 4. Scattered degenerative findings in the hindfoot and midfoot. 5. Generalized low-level muscular edema the foot, probably neurogenic. Electronically Signed   By: Gaylyn Rong M.D.   On: 01/20/2023 16:12   VAS [place] ABI WITH/WO TBI  Result Date: 01/20/2023  LOWER EXTREMITY DOPPLER STUDY Patient Name:  WILLETTE MUDRY  Date of Exam:  01/20/2023 Medical Rec #: [id number]      Accession #:    [id number] Date of Birth: [birth year]       Patient Gender: F Patient Age:   60 years Exam [facility]  [facility] Procedure:      VAS [place] ABI WITH/WO TBI Referring Phys: JEFFREY HATCHER --------------------------------------------------------------------------------  Indications: Ulceration. High Risk Factors: Hypertension, hyperlipidemia, Diabetes.  Limitations: Today's exam was limited due to patient intolerant to cuff              pressure, an open wound, involuntary patient movement and skin              induration, L arm IV. Comparison Study: No prior studies. Performing Technologist: Olen Cordial RVT  Examination Guidelines: A complete evaluation includes at minimum, Doppler waveform signals and systolic blood pressure reading at the level of bilateral brachial, anterior tibial, and posterior tibial arteries, when vessel segments are accessible. Bilateral testing is considered an integral part of a complete examination. Photoelectric Plethysmograph (PPG) waveforms and toe systolic pressure readings are included as required and additional duplex testing as needed.  Limited examinations for reoccurring indications may be performed as noted.  ABI Findings: +---------+------------------+-----+----------+--------+ Right    Rt Pressure (mmHg)IndexWaveform  Comment  +---------+------------------+-----+----------+--------+ Brachial                                  IV       +---------+------------------+-----+----------+--------+ PTA      247               1.56 monophasic         +---------+------------------+-----+----------+--------+ DP       130               0.82 monophasic         +---------+------------------+-----+----------+--------+ Great Toe51                0.32                    +---------+------------------+-----+----------+--------+ +---------+------------------+-----+----------+-------+ Left     Lt Pressure (mmHg)IndexWaveform  Comment +---------+------------------+-----+----------+-------+ Brachial 158                    triphasic         +---------+------------------+-----+----------+-------+ PTA      237               1.50 monophasic        +---------+------------------+-----+----------+-------+ DP       254               1.61 monophasic        +---------+------------------+-----+----------+-------+ Great Toe62                0.39                   +---------+------------------+-----+----------+-------+ +-------+-----------+-----------+------------+------------+ ABI/TBIToday's ABIToday's TBIPrevious ABIPrevious TBI +-------+-----------+-----------+------------+------------+ Right  [place]         0.32                                +-------+-----------+-----------+------------+------------+ Left   [place]         0.39                                +-------+-----------+-----------+------------+------------+  Summary: Right: Resting right ankle-brachial index indicates noncompressible right lower extremity arteries. The right toe-brachial index is abnormal. Left: Resting left ankle-brachial index  indicates noncompressible left lower extremity arteries. The left toe-brachial index is abnormal. *See table(s) above for measurements and observations.  Electronically signed by Sherald Hess MD on 01/20/2023 at 2:10:57 PM.    Final    ECHOCARDIOGRAM COMPLETE  Result Date: 01/20/2023    ECHOCARDIOGRAM REPORT   Patient Name:   SOFIAH LYNE Date of Exam: 01/20/2023 Medical Rec #:  [id number]     Height:       63.0 in Accession #:    [id number]    Weight:       182.1 lb Date of Birth:  31-Jan-1931      BSA:          1.858 m Patient Age:    89 years      BP:           162/96 mmHg Patient Gender: F             HR:           81 bpm. Exam Location:  Inpatient Procedure: 2D Echo, Cardiac Doppler and Color Doppler Indications:    Other abnormalities of the heart R00.8  History:        Patient has no prior history of Echocardiogram examinations.                 PAD; Risk Factors:Non-Smoker, Diabetes and Dyslipidemia.  Sonographer:    Dondra Prader RVT RCS Referring Phys: [id number] JEFFREY C HATCHER  Sonographer Comments: Patient done in supine position IMPRESSIONS  1. Left ventricular ejection fraction, by estimation, is 60 to 65%. The left ventricle has normal function. The left ventricle has no regional wall motion abnormalities. Left ventricular diastolic parameters are consistent with Grade I diastolic dysfunction (impaired relaxation).  2. Right ventricular systolic function is normal. The right ventricular size is normal.  3. Left atrial size was moderately dilated.  4. The mitral valve is normal in structure. Mild mitral valve regurgitation. No evidence of mitral stenosis.  5. The aortic valve is tricuspid. There is mild calcification of the aortic valve. Aortic valve regurgitation is not visualized. Aortic valve sclerosis/calcification is present, without any evidence of aortic stenosis.  6. The inferior vena cava is normal in size with greater than 50% respiratory variability, suggesting right atrial pressure of 3  mmHg. FINDINGS  Left Ventricle: Left ventricular ejection fraction, by estimation, is 60 to 65%. The left ventricle has normal function. The left ventricle has no regional wall motion abnormalities. The left ventricular internal cavity size was normal in size. There is  no left ventricular hypertrophy. Left ventricular diastolic parameters are consistent with Grade I diastolic dysfunction (impaired relaxation). Right Ventricle: The right ventricular size is normal. No increase in right ventricular wall thickness. Right ventricular systolic function is normal. Left Atrium: Left atrial size was moderately dilated. Right Atrium: Right atrial size was normal in size. Pericardium: There is no evidence of pericardial effusion. Mitral Valve: The mitral valve is normal in structure. Mild mitral valve regurgitation. No evidence of mitral valve stenosis. Tricuspid Valve: The tricuspid valve is normal in structure. Tricuspid valve regurgitation is trivial. No evidence of tricuspid stenosis. Aortic Valve: The aortic valve is tricuspid. There is mild calcification of the aortic valve. Aortic valve regurgitation is not visualized. Aortic valve sclerosis/calcification is present, without any evidence of aortic stenosis. Aortic valve mean gradient measures 6.0 mmHg.  Aortic valve peak gradient measures 10.2 mmHg. Aortic valve area, by VTI measures 2.36 cm. Pulmonic Valve: The pulmonic valve was normal in structure. Pulmonic valve regurgitation is not visualized. No evidence of pulmonic stenosis. Aorta: The aortic root is normal in size and structure. Venous: The inferior vena cava is normal in size with greater than 50% respiratory variability, suggesting right atrial pressure of 3 mmHg. IAS/Shunts: No atrial level shunt detected by color flow Doppler.  LEFT VENTRICLE PLAX 2D LVIDd:         5.10 cm   Diastology LVIDs:         3.80 cm   LV e' medial:    7.40 cm/s LV PW:         0.90 cm   LV E/e' medial:  10.7 LV IVS:        1.00 cm    LV e' lateral:   7.94 cm/s LVOT diam:     2.10 cm   LV E/e' lateral: 9.9 LV SV:         80 LV SV Index:   43 LVOT Area:     3.46 cm  RIGHT VENTRICLE RV Basal diam:  3.00 cm LEFT ATRIUM             Index        RIGHT ATRIUM           Index LA diam:        4.50 cm 2.42 cm/m   RA Area:     10.40 cm LA Vol (A2C):   54.9 ml 29.55 ml/m  RA Volume:   21.00 ml  11.30 ml/m LA Vol (A4C):   44.6 ml 24.00 ml/m LA Biplane Vol: 52.3 ml 28.15 ml/m  AORTIC VALVE                     PULMONIC VALVE AV Area (Vmax):    2.29 cm      PV Vmax:       1.18 m/s AV Area (Vmean):   2.31 cm      PV Peak grad:  5.6 mmHg AV Area (VTI):     2.36 cm AV Vmax:           160.00 cm/s AV Vmean:          108.000 cm/s AV VTI:            0.341 m AV Peak Grad:      10.2 mmHg AV Mean Grad:      6.0 mmHg LVOT Vmax:         106.00 cm/s LVOT Vmean:        71.900 cm/s LVOT VTI:          0.232 m LVOT/AV VTI ratio: 0.68  AORTA Ao Root diam: 2.50 cm Ao Asc diam:  3.50 cm MITRAL VALVE MV Area (PHT): 3.85 cm     SHUNTS MV Decel Time: 197 msec     Systemic VTI:  0.23 m MV E velocity: 78.90 cm/s   Systemic Diam: 2.10 cm MV A velocity: 123.00 cm/s MV E/A ratio:  0.64 Arvilla Meres MD Electronically signed by Arvilla Meres MD Signature Date/Time: 01/20/2023/12:05:09 PM    Final     Assessment/Plan:   Principal Problem:   Diabetic wet gangrene of the foot [facility]) Active Problems:   Hypothyroidism   Uncontrolled type 2 diabetes mellitus with hyperglycemia (HCC)   Diabetic infection of right foot (HCC)   Severe protein-calorie malnutrition (HCC)  R calcaneal necrotic diabetic ulcer Peripheral vascular disease Hyperlipidemia Stable vital signs today. Blood cultures with no growth to date. MRI foot with large heel ulceration extending to involve medial band of plantar fascia. Abnormal ABIs this admission confirming peripheral vascular disease. Orthopedic surgery consulted; recommended amputation given degree of involvement. Patient declined and  understands the consequences of declining surgery. She continues on daptomycin and sozyn. Given difficult situation, the team has consulted palliative care to help ellucidate patient's goals of care given the high risk of progression. - Continue IV daptomycin and sozyn - Continue ASA - Wound care as able - Continue Gabapentin 100 mg daily - Pain: tylenol scheduled and diclofenac gel  T2DM Peripheral neuropathy Now on full diet. Will likely need basal insulin. - Will add 20 u Semglee pending trends from the day - Moderate SSI  Hypertension Elevated creatinine Continues to be hypertensive. Holding ARB in setting of elevated creatinine with unknown baseline. - Increase amlodipine to 10 mg - Continue Metoprolol tartrate 100 mg daily  Hypothyroidism Admission TSH 8.051 -Continue Levothyroxine 68.5 mcg daily  Diet: Carb-Modified VTE: Enoxaparin Code: Full PT/OT recs: Pending, TOC recs: Pending Family Update: Will update son or grandson today. Patient is pending palliative care talks as this will guide dispo   Morene Crocker, MD Internal Medicine Resident PGY-2 Please contact the on call pager after 5 pm and on weekends at [phone].

## 2023-01-22 DIAGNOSIS — Z7189 Other specified counseling: Secondary | ICD-10-CM | POA: Diagnosis not present

## 2023-01-22 DIAGNOSIS — Z515 Encounter for palliative care: Secondary | ICD-10-CM | POA: Diagnosis not present

## 2023-01-22 DIAGNOSIS — E1152 Type 2 diabetes mellitus with diabetic peripheral angiopathy with gangrene: Secondary | ICD-10-CM | POA: Diagnosis not present

## 2023-01-22 LAB — CBC WITH DIFFERENTIAL/PLATELET
Abs Immature Granulocytes: 0.07 10*3/uL (ref 0.00–0.07)
Basophils Absolute: 0.1 10*3/uL (ref 0.0–0.1)
Basophils Relative: 1 %
Eosinophils Absolute: 0.3 10*3/uL (ref 0.0–0.5)
Eosinophils Relative: 4 %
HCT: 33 % — ABNORMAL LOW (ref 36.0–46.0)
Hemoglobin: 10.7 g/dL — ABNORMAL LOW (ref 12.0–15.0)
Immature Granulocytes: 1 %
Lymphocytes Relative: 10 %
Lymphs Abs: 0.8 10*3/uL (ref 0.7–4.0)
MCH: 30.3 pg (ref 26.0–34.0)
MCHC: 32.4 g/dL (ref 30.0–36.0)
MCV: 93.5 fL (ref 80.0–100.0)
Monocytes Absolute: 0.7 10*3/uL (ref 0.1–1.0)
Monocytes Relative: 8 %
Neutro Abs: 6.9 10*3/uL (ref 1.7–7.7)
Neutrophils Relative %: 76 %
Platelets: 473 10*3/uL — ABNORMAL HIGH (ref 150–400)
RBC: 3.53 MIL/uL — ABNORMAL LOW (ref 3.87–5.11)
RDW: 15 % (ref 11.5–15.5)
WBC: 8.9 10*3/uL (ref 4.0–10.5)
nRBC: 0 % (ref 0.0–0.2)

## 2023-01-22 LAB — RENAL FUNCTION PANEL
Albumin: 2.5 g/dL — ABNORMAL LOW (ref 3.5–5.0)
Anion gap: 11 (ref 5–15)
BUN: 17 mg/dL (ref 8–23)
CO2: 26 mmol/L (ref 22–32)
Calcium: 8.4 mg/dL — ABNORMAL LOW (ref 8.9–10.3)
Chloride: 97 mmol/L — ABNORMAL LOW (ref 98–111)
Creatinine, Ser: 1.09 mg/dL — ABNORMAL HIGH (ref 0.44–1.00)
GFR, Estimated: 48 mL/min — ABNORMAL LOW (ref >60)
Glucose, Bld: 308 mg/dL — ABNORMAL HIGH (ref 70–99)
Phosphorus: 3.1 mg/dL (ref 2.5–4.6)
Potassium: 3.3 mmol/L — ABNORMAL LOW (ref 3.5–5.1)
Sodium: 134 mmol/L — ABNORMAL LOW (ref 135–145)

## 2023-01-22 LAB — GLUCOSE, CAPILLARY
Glucose-Capillary: 286 mg/dL — ABNORMAL HIGH (ref 70–99)
Glucose-Capillary: 233 mg/dL — ABNORMAL HIGH (ref 70–99)
Glucose-Capillary: 198 mg/dL — ABNORMAL HIGH (ref 70–99)

## 2023-01-22 MED ORDER — LIDOCAINE 5 % EX PTCH
1.0000 | MEDICATED_PATCH | CUTANEOUS | Status: DC
  Administered 2023-01-22: 1 via TRANSDERMAL
  Filled 2023-01-22: qty 1

## 2023-01-22 MED ORDER — INSULIN GLARGINE-YFGN 100 UNIT/ML ~~LOC~~ SOLN
20.0000 [IU] | Freq: Every day | SUBCUTANEOUS | Status: DC
  Administered 2023-01-22: 20 [IU] via SUBCUTANEOUS
  Filled 2023-01-22 (×2): qty 0.2

## 2023-01-22 NOTE — Plan of Care (Signed)
  Problem: Coping: Goal: Ability to adjust to condition or change in health will improve Outcome: Progressing   Problem: Fluid Volume: Goal: Ability to maintain a balanced intake and output will improve Outcome: Progressing   Problem: Metabolic: Goal: Ability to maintain appropriate glucose levels will improve Outcome: Progressing   Problem: Nutritional: Goal: Maintenance of adequate nutrition will improve Outcome: Progressing   Problem: Skin Integrity: Goal: Risk for impaired skin integrity will decrease Outcome: Progressing   Problem: Tissue Perfusion: Goal: Adequacy of tissue perfusion will improve Outcome: Progressing   Problem: Clinical Measurements: Goal: Diagnostic test results will improve Outcome: Progressing Goal: Respiratory complications will improve Outcome: Progressing   Problem: Nutrition: Goal: Adequate nutrition will be maintained Outcome: Progressing   Problem: Activity: Goal: Risk for activity intolerance will decrease Outcome: Progressing   Problem: Coping: Goal: Level of anxiety will decrease Outcome: Progressing

## 2023-01-22 NOTE — Plan of Care (Signed)

## 2023-01-22 NOTE — Progress Notes (Signed)
Daily Progress Note   Patient Name: Amber Dudley       Date: 01/22/2023 DOB: Apr 03, 1931  Age: 89 y.o. MRN#: [id number] Attending Physician: Inez Catalina, MD Primary Care Physician: Garald Braver Admit Date: 01/19/2023  Reason for Consultation/Follow-up: Establishing goals of care  Length of Stay: 1  Current Medications: Scheduled Meds:   acetaminophen  1,000 mg Oral Q8H   amLODipine  10 mg Oral Daily   cyanocobalamin  500 mcg Oral Daily   diclofenac Sodium  4 g Topical QID   enoxaparin (LOVENOX) injection  40 mg Subcutaneous Q24H   gabapentin  100 mg Oral Daily   Gerhardt's butt cream   Topical BID   hydrocerin   Topical Daily   insulin aspart  0-15 Units Subcutaneous TID WC   insulin aspart  0-5 Units Subcutaneous QHS   insulin glargine-yfgn  20 Units Subcutaneous Daily   levothyroxine  68.5 mcg Oral Q0600   lidocaine  1 patch Transdermal Q24H   lidocaine  1 patch Transdermal Q24H   metoprolol succinate  100 mg Oral Daily   multivitamin with minerals  1 tablet Oral Daily    Continuous Infusions:  DAPTOmycin (CUBICIN) 500 mg in sodium chloride 0.9 % IVPB 500 mg (01/21/23 1441)   piperacillin-tazobactam (ZOSYN)  IV 3.375 g (01/22/23 1043)    PRN Meds: albuterol, senna-docusate  Physical Exam Vitals reviewed.  HENT:     Head: Normocephalic and atraumatic.  Pulmonary:     Effort: Pulmonary effort is normal.  Musculoskeletal:     Right lower leg: Edema present.     Left lower leg: Edema present.     Right ankle: Swelling present.     Left ankle: Swelling present.     Comments: Bilateral venous stasis  Feet:     Comments: R heel necrotic diabetic foot ulcer Neurological:     Mental Status: She is alert and oriented to person, place, and time.  Psychiatric:         Mood and Affect: Mood normal.        Behavior: Behavior normal.        Thought Content: Thought content normal.        Judgment: Judgment normal.             Vital Signs: BP (!) 144/81 (BP  Location: Left Arm)   Pulse 82   Temp 98.1 F (36.7 C) (Oral)   Resp 18   Ht 5\' 3"  (1.6 m)   Wt 82.6 kg   SpO2 97%   BMI 32.26 kg/m  SpO2: SpO2: 97 % O2 Device: O2 Device: Room Air O2 Flow Rate:    Intake/output summary:  Intake/Output Summary (Last 24 hours) at 01/22/2023 1544 Last data filed at 01/22/2023 6045 Gross per 24 hour  Intake 185.96 ml  Output 300 ml  Net -114.04 ml   LBM: Last BM Date : 01/21/23 Baseline Weight: Weight: 86.2 kg Most recent weight: Weight: 82.6 kg       Palliative Assessment/Data:40%      Patient Active Problem List   Diagnosis Date Noted   Hypothyroidism 01/20/2023   Uncontrolled type 2 diabetes mellitus with hyperglycemia (HCC) 01/20/2023   Diabetic infection of right foot (HCC) 01/20/2023   Severe protein-calorie malnutrition (HCC) 01/20/2023   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   Uncontrolled type 2 diabetes mellitus without complication, with long-term current use of insulin 07/01/2015   Essential hypertension, benign 07/01/2015   Hyperlipidemia 07/01/2015    Palliative Care Assessment & Plan   Patient Profile: 89 y.o. female  with past medical history of diabetic neuropathy, HTN, Insulin dependent T2DM, Hypothyroidism, and chronic lower extremity edema admitted on 01/19/2023 for further evaluation and treatment of a right calcaneal subacute wet gangrenous diabetic ulcer.    She is contemplating the possibility of an amputation.  Today's Discussion: The patient states she had a better night last night. Her pain is a little better but she continues to have neck pain.  Meeting with Dr. Daiva Eves, PMT, patient, and son [name] (by phone). Discussed options for management of R heel necrotic diabetic foot ulcer including  amputation and debridement. Patient and her son had many questions which were answered. The topic of quality of life and disposition were addressed. We discussed that she would not be able to return home to live independently and would likely need a SNF. Her son told her that the family is willing to support her decision; although he favors amputation.   Her son will be flying up Tuesday or Wednesday to support her.  The patient would like to make a decision tomorrow.   Recommendations/Plan: Full code Patient to make decision about amputation Spiritual care consult for HCPOA assistance Continued PMT support Messaged CM/SW regarding her concerns about insurance coverage  Goals of Care and Additional Recommendations: Limitations on Scope of Treatment: Full Scope Treatment  Code Status:    Code Status Orders  (From admission, onward)           Start     Ordered   01/19/23 1828  Full code  Continuous       Question:  By:  Answer:  Consent: discussion documented in EHR   01/19/23 1832             Care plan was discussed with Dr. Daiva Eves  Thank you for allowing the Palliative Medicine Team to assist in the care of this patient.  Time spent: 75 minutes   Sherryll Burger, NP  Please contact Palliative Medicine Team phone at [phone] for questions and concerns.

## 2023-01-22 NOTE — Progress Notes (Signed)
Hospital day#1 Subjective:   Summary: Amber Dudley is a 89 yo person living with a history of HTN, HLD, T2DM with severe lower extremity neuropathy,lymphedema, peripheral vascular disease, macular degeneration, hypothyroidism, independent living prior to admission admitted to the hospital for a R heel necrotic diabetic foot ulcer now on hospital day 2 now undergoing palliative care talks regarding disposition, which will guide patients decision about surgery  Overnight Events: none  Patient feeling well this AM, unchanged from yesterday. Had family meeting with son Amber Dudley, who lives in [place], and [place] from Palliative Care at [facility]. Patient and son asked many questions regarding her current medical condition and the possibilities of limited debridement vs amputation. Discussed her significant peripheral vascular disease and the extent of her R foot infection. Patient continues to grapple with decisions about living her home. Her son Amber Dudley discussed the lack of space and concern for cleanliness of patient's home. They both openly discussed the likelihood that patient would need SNF. They continue to hve open conversations about next steps. Patient would like to make a decision by tomorrow.    Objective:  Vital signs in last 24 hours: Vitals:   01/21/23 0805 01/21/23 1432 01/21/23 2023 01/22/23 0442  BP: (!) 152/69 (!) 141/63 135/61 132/63  Pulse: 86 78 84 80  Resp: 18 18 17 16   Temp: 98 F (36.7 C) 98.1 F (36.7 C) 97.7 F (36.5 C)   TempSrc: Oral Oral Oral   SpO2: 94% 98% 96% (!) 83%  Weight:      Height:       Supplemental O2: Room Air SpO2: (!) 83 % Filed Weights   01/19/23 1415 01/19/23 1943 01/19/23 2014  Weight: 86.2 kg 82 kg 82.6 kg    Physical Exam:  Constitutional:well-appearing elderly woman sitting up on chair in NAD HENT: normocephalic atraumatic, moist mucous membranes Cardiovascular:RRR Pulmonary/Chest: Normal work of breathing. Speaking in full  sentences Neurological: alert & oriented x 3, conversing appropriately Skin: bilateral lower extremity with thickened skin/papillomatous lymphedema, hemosiderin skin changes to the knee. Large R calcaneal wound with necrosis. Malodorous wound with purulent drainage.  Psych: Pleasant mood and affect    Intake/Output Summary (Last 24 hours) at 01/22/2023 0613 Last data filed at 01/22/2023 4098 Gross per 24 hour  Intake 185.96 ml  Output 300 ml  Net -114.04 ml   Net IO Since Admission: -115.04 mL [01/22/23 0613]  Pertinent Labs:    Latest Ref Rng & Units 01/21/2023    7:44 AM 01/20/2023    9:11 AM 01/19/2023    2:28 PM  CBC  WBC 4.0 - 10.5 K/uL 7.7  7.4  7.0   Hemoglobin 12.0 - 15.0 g/dL 11.9  14.7  9.7   Hematocrit 36.0 - 46.0 % 32.0  33.1  31.0   Platelets 150 - 400 K/uL 474  395  440        Latest Ref Rng & Units 01/21/2023    7:44 AM 01/20/2023    9:11 AM 01/19/2023    2:28 PM  CMP  Glucose 70 - 99 mg/dL 829  562  130   BUN 8 - 23 mg/dL 12  12  11    Creatinine 0.44 - 1.00 mg/dL 8.65  7.84  6.96   Sodium 135 - 145 mmol/L 139  142  138   Potassium 3.5 - 5.1 mmol/L 3.3  3.8  3.5   Chloride 98 - 111 mmol/L 102  107  103   CO2 22 - 32 mmol/L  26  27  27    Calcium 8.9 - 10.3 mg/dL 8.3  8.5  8.4    Blood cultures: NGTD in 3 days   Imaging: No results found.  Assessment/Plan:   Principal Problem:   Diabetic wet gangrene of the foot [facility]) Active Problems:   Hypothyroidism   Uncontrolled type 2 diabetes mellitus with hyperglycemia (HCC)   Diabetic infection of right foot (HCC)   Severe protein-calorie malnutrition (HCC)   R calcaneal necrotic diabetic ulcer Peripheral vascular disease Hyperlipidemia Stable vital signs today. Blood cultures with no growth to date. MRI foot with large heel ulceration extending to involve medial band of plantar fascia. Abnormal ABIs this admission confirming peripheral vascular disease. Orthopedic surgery consulted; recommended amputation  given degree of involvement. Patient declined and understands the consequences of declining surgery. She continues on daptomycin and sozyn. Palliative care on board. Family continues to have conversations about how to proceed; patient leaning towards amputation but would like to think about it tomorrow. Appreciate palliative care's assistance in guiding conversations with patient and her family. - Continue IV daptomycin and sozyn - Continue ASA - Wound care as able - Continue Gabapentin 100 mg daily - Pain: tylenol scheduled and diclofenac gel  T2DM Peripheral neuropathy Baseline glucose elevated on 7/21 - Will add 20 u Semglee today - Moderate SSI  Hypertension Elevated creatinine Managed today. Holding ARB in setting of elevated creatinine with unknown baseline. - Continue amlodipine to 10 mg - Continue Metoprolol tartrate 100 mg daily  Hypothyroidism Admission TSH 8.051 -Continue Levothyroxine 68.5 mcg daily  Diet: Carb-Modified VTE: Enoxaparin Code: Full PT/OT recs: Pending, TOC recs: Pending Family Update: Will update son or grandson today. Patient is pending palliative care talks as this will guide dispo   Morene Crocker, MD Internal Medicine Resident PGY-2 Please contact the on call pager after 5 pm and on weekends at [phone].

## 2023-01-23 DIAGNOSIS — I10 Essential (primary) hypertension: Secondary | ICD-10-CM | POA: Diagnosis not present

## 2023-01-23 DIAGNOSIS — Z7189 Other specified counseling: Secondary | ICD-10-CM

## 2023-01-23 DIAGNOSIS — E1152 Type 2 diabetes mellitus with diabetic peripheral angiopathy with gangrene: Secondary | ICD-10-CM | POA: Diagnosis not present

## 2023-01-23 DIAGNOSIS — Z794 Long term (current) use of insulin: Secondary | ICD-10-CM

## 2023-01-23 LAB — RENAL FUNCTION PANEL
Albumin: 2 g/dL — ABNORMAL LOW (ref 3.5–5.0)
Anion gap: 9 (ref 5–15)
BUN: 16 mg/dL (ref 8–23)
CO2: 26 mmol/L (ref 22–32)
Calcium: 8 mg/dL — ABNORMAL LOW (ref 8.9–10.3)
Chloride: 102 mmol/L (ref 98–111)
Creatinine, Ser: 1.12 mg/dL — ABNORMAL HIGH (ref 0.44–1.00)
GFR, Estimated: 47 mL/min — ABNORMAL LOW (ref >60)
Glucose, Bld: 161 mg/dL — ABNORMAL HIGH (ref 70–99)
Phosphorus: 3.4 mg/dL (ref 2.5–4.6)
Potassium: 3.2 mmol/L — ABNORMAL LOW (ref 3.5–5.1)
Sodium: 137 mmol/L (ref 135–145)

## 2023-01-23 LAB — CULTURE, BLOOD (ROUTINE X 2): Special Requests: ADEQUATE

## 2023-01-23 LAB — CBC
HCT: 27.7 % — ABNORMAL LOW (ref 36.0–46.0)
Hemoglobin: 8.9 g/dL — ABNORMAL LOW (ref 12.0–15.0)
MCH: 30 pg (ref 26.0–34.0)
MCHC: 32.1 g/dL (ref 30.0–36.0)
MCV: 93.3 fL (ref 80.0–100.0)
Platelets: 407 10*3/uL — ABNORMAL HIGH (ref 150–400)
RBC: 2.97 MIL/uL — ABNORMAL LOW (ref 3.87–5.11)
RDW: 15.2 % (ref 11.5–15.5)
WBC: 8.1 10*3/uL (ref 4.0–10.5)
nRBC: 0 % (ref 0.0–0.2)

## 2023-01-23 LAB — GLUCOSE, CAPILLARY
Glucose-Capillary: 165 mg/dL — ABNORMAL HIGH (ref 70–99)
Glucose-Capillary: 199 mg/dL — ABNORMAL HIGH (ref 70–99)
Glucose-Capillary: 238 mg/dL — ABNORMAL HIGH (ref 70–99)
Glucose-Capillary: 239 mg/dL — ABNORMAL HIGH (ref 70–99)

## 2023-01-23 MED ORDER — INSULIN GLARGINE-YFGN 100 UNIT/ML ~~LOC~~ SOLN
25.0000 [IU] | Freq: Every day | SUBCUTANEOUS | Status: DC
  Administered 2023-01-23: 25 [IU] via SUBCUTANEOUS
  Filled 2023-01-23 (×2): qty 0.25

## 2023-01-23 MED ORDER — POTASSIUM CHLORIDE CRYS ER 20 MEQ PO TBCR
40.0000 meq | EXTENDED_RELEASE_TABLET | Freq: Once | ORAL | Status: AC
  Administered 2023-01-23: 40 meq via ORAL
  Filled 2023-01-23: qty 2

## 2023-01-23 NOTE — Plan of Care (Signed)
  Problem: Coping: Goal: Ability to adjust to condition or change in health will improve Outcome: Progressing   Problem: Health Behavior/Discharge Planning: Goal: Ability to manage health-related needs will improve Outcome: Progressing   Problem: Education: Goal: Knowledge of General Education information will improve Description: Including pain rating scale, medication(s)/side effects and non-pharmacologic comfort measures Outcome: Progressing   Problem: Health Behavior/Discharge Planning: Goal: Ability to manage health-related needs will improve Outcome: Progressing

## 2023-01-23 NOTE — Progress Notes (Addendum)
   Subjective:  Amber Dudley is a pleasant 89 year old woman with a past medical history of HTN, Insulin dependent T2DM, Hypothyroidism, and chronic lower extremity edema who is admitted for further evaluation and treatment of a right calcaneal subacute wet gangrenous diabetic ulcer. MRI showed Large plantar heel ulceration with internal gas and hypo-enhancement measuring about 3.0 by 4.5 by 4.2 cm without osteomyelitis. Orthopedic surgery recommended AKA to which the patient is undecided/reluctant at the moment. Palliative care was consulted to discuss goals of care to guide her with therapy choices based on her goals.   Today, she reported feeling "fair" and that she is still deciding on whether or not to have the AKA. She would be ok with going to a short term rehab but does not wish to say in a long term care facility. She was notified of her options: AKA, vascular surgery consult, and palliative care. Her son will be here tomorrow or Wednesday and her grandson will be here later today.  Objective:  Vital signs in last 24 hours: Vitals:   01/22/23 1410 01/22/23 2012 01/23/23 0459 01/23/23 0733  BP: (!) 144/81 (!) 145/65 (!) 146/59 132/66  Pulse: 82 78 82 78  Resp: 18 17 17 18   Temp: 98.1 F (36.7 C) (!) 97.5 F (36.4 C)  98.2 F (36.8 C)  TempSrc: Oral Oral    SpO2: 97% 99% 92% 97%  Weight:      Height:       Physical Exam: General: Sitting on the bed, conversing  Cardiac:RRR, no murmurs Pulmonary:clear to auscultate bilaterally,  normal work of breathing  ZOX:WRUEA gangrene diabetic ulcer on right calcaneal is covered in dressing, malodorous  Skin:warm and dry, bilateral discoloration to lower extremities to the level of the knees Neuro: alert Psych:normal mood and worried affect  Assessment/Plan:  Principal Problem:   Diabetic wet gangrene of the foot (HCC) Active Problems:   Hypothyroidism   Uncontrolled type 2 diabetes mellitus with hyperglycemia (HCC)   Diabetic  infection of right foot (HCC)   Severe protein-calorie malnutrition (HCC)   Right diabetic gangrenous ulcer right heel MRI showed large plantar heel ulceration with internal gas and hypo enhancement measuring about 3.0 by 4.5 by 4.2 cm without osteomyelitis.  Patient is currently undecided on her treatment plan but did report "wondering if it is not her time to leave [place]". After goals of care discussion with palliative care, she is still undecided on how to proceed. This morning, we did offer consulting vascular surgery to determine any other options that would avoid amputation.  -Will update grandson later today once he comes in  -Vascular surgery is consulted, will await for their recommendations: ABIs on R and L LE showed non-compressible vascular arteries   -May consult ID later -Continue IV daptomycin and Zosyn  -Continue following with palliative care   T2DM -Increased basal insulin to 25 units due to patient requiring an additional 15 units of SSI -Will titrate if needed -Hold metformin   HTN -Amlodipine increased to 10 mg -Toprol -XL increased to 100 mg -Will continue this regimen as her BP has gradually decreased since admission -May add hydralazine PRN if needed  Hypothyroidism -Levothyroxine 68.   Delirium Precautions Started   Prior to Admission Living Arrangement:home Anticipated Discharge Location:SNF/home Barriers to Discharge:Medical treatment Dispo: Anticipated discharge in approximately 2-3 day(s).   Faith Rogue, DO 01/23/2023, 9:44 AM Pager: [phone] After 5pm on weekdays and 1pm on weekends: On Call pager [phone]

## 2023-01-23 NOTE — Consult Note (Addendum)
Hospital Consult    Reason for Consult:  right heel ulceration Requesting Physician:  Hessie Diener MRN #:  [id number]  History of Present Illness: This is a 89 y.o. female who presented to the hospital with right heel ulceration.   She states that she is unsure how long the ulcer has been present, but last week, she hit her foot on the refrigerator and there was a lot of blood.  She woke up and there was more blood so she and her grandson come to the hospital.    She denies any pain in her feet at night.  She tells me she slept good last night.  She lives independently and her grandson lives across the street and helps her.  She is very spry.   She states that she has arthritis in her knees, which is what limits her walking.  She also has lymphedema.  She states she does have feeling in her feet but could not see this area on her heel.    Plain film xray of the foot revealed soft tissue ulceration overlying the plantar surface of the calcaneus with overlying bandage material and subcutaneous gas.  She was seen by Dr. Lajoyce Corners who recommended right AKA.   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  She grew up in [place] and lived here until she got married.  Her daughter died of melanoma.  Her husband had been disabled.   She states that she has been told about amputation and low risk of heeling.  She states she was also given the option of hospice.  Her concern is that she has been independent her entire life and worries about relying on others.  The pt is on a statin for cholesterol management.  The pt is on a daily aspirin.   Other AC:  Lovenox The pt is on ARB, BB, diuretic for hypertension.   The pt is  on medication for diabetes PTA. Tobacco hx:  never  Past Medical History:  Diagnosis Date   Diabetes mellitus, type II (HCC)    Hyperlipidemia    Hypertension    Hypothyroidism     Past Surgical History:  Procedure Laterality Date   CATARACT EXTRACTION      OTHER SURGICAL HISTORY     OTHER SURGICAL HISTORY     L hip   OTHER SURGICAL HISTORY     L Arm fx   TONSILLECTOMY      No Known Allergies  Prior to Admission medications   Medication Sig Start Date End Date Taking? Authorizing Provider  amLODipine (NORVASC) 5 MG tablet Take 5 mg by mouth daily. 04/06/20  Yes [provider]  aspirin EC 81 MG tablet Take 81 mg by mouth daily.   Yes [provider]  furosemide (LASIX) 20 MG tablet Take 20 mg by mouth.   Yes [provider]  Insulin Glargine (BASAGLAR KWIKPEN ) Inject 15-20 Units into the skin 2 (two) times daily. 15 units in the am, and 20 units at Night.   Yes [provider]  levothyroxine (SYNTHROID) 112 MCG tablet Take 112 mcg by mouth daily before breakfast.   Yes [provider]  liothyronine (CYTOMEL) 5 MCG tablet Take 5 mcg by mouth daily.   Yes [provider]  losartan (COZAAR) 100 MG tablet Take 100 mg by mouth daily.   Yes [provider]  metFORMIN (GLUCOPHAGE) 1000 MG tablet Take 1,000 mg by mouth 2 (two) times daily with  a meal.   Yes [provider]  metoprolol succinate (TOPROL-XL) 50 MG 24 hr tablet Take 50 mg by mouth daily. Take with or immediately following a meal.   Yes [provider]  Multiple Vitamin (MULTIVITAMIN) capsule Take 1 capsule by mouth daily.   Yes [provider]  pravastatin (PRAVACHOL) 10 MG tablet Take 10 mg by mouth every other day.    Yes [provider]  vitamin C (ASCORBIC ACID) 500 MG tablet Take 500 mg by mouth daily.   Yes [provider]  Vitamin D, Ergocalciferol, 50 MCG (2000 UT) CAPS Take 1 capsule by mouth daily.   Yes [provider]    Social History   Socioeconomic History   Marital status: Widowed    Spouse name: Not on file   Number of children: Not on file   Years of education: Not on file   Highest education level: Not on file  Occupational History   Not on  file  Tobacco Use   Smoking status: Never   Smokeless tobacco: Never  Substance and Sexual Activity   Alcohol use: No    Alcohol/week: 0.0 standard drinks of alcohol   Drug use: No   Sexual activity: Not on file  Other Topics Concern   Not on file  Social History Narrative   Not on file   Social Determinants of Health   Financial Resource Strain: Not on file  Food Insecurity: Not on file  Transportation Needs: Not on file  Physical Activity: Not on file  Stress: Not on file  Social Connections: Not on file  Intimate Partner Violence: Not on file     Family History  Problem Relation Age of Onset   Heart failure Mother    Heart disease Father    Stroke Father    Diabetes Sister    Hypertension Sister    Heart failure Sister    Cancer Sister     ROS: [x]  Positive   [ ]  Negative   [ ]  All sytems reviewed and are negative  Cardiac: []  chest pain/pressure []  hx MI []  SOB   Vascular: []  pain in legs while walking []  pain in legs at rest []  pain in legs at night [x]  non-healing ulcers []  hx of DVT [x]  swelling in legs  Pulmonary: []  asthma/wheezing []  home O2  Neurologic: []  hx of CVA []  mini stroke   Hematologic: []  hx of cancer  Endocrine:   [x]  diabetes [x]  thyroid disease  GI []  GERD  GU: []  CKD/renal failure []  HD--[]  M/W/F or []  T/T/S  Psychiatric: []  anxiety []  depression  Musculoskeletal: []  arthritis []  joint pain  Integumentary: []  rashes []  ulcers  Constitutional: []  fever  []  chills  Physical Examination  Vitals:   01/23/23 0459 01/23/23 0733  BP: (!) 146/59 132/66  Pulse: 82 78  Resp: 17 18  Temp:  98.2 F (36.8 C)  SpO2: 92% 97%   Body mass index is 32.26 kg/m.  General:  WDWN in NAD Gait: Not observed HENT: WNL, normocephalic Pulmonary: normal non-labored breathing Cardiac: regular Abdomen:  soft, NT; aortic pulse is not palpable Skin: without rashes Vascular Exam/Pulses:  Right Left  Radial 2+  (normal) 2+ (normal)  Femoral 2+ (normal) 2+ (normal)  Distal pulses are not palpable Extremities:  Lymphedema with hardened skin bilaterally; right heel is malodorous       Musculoskeletal: no muscle wasting or atrophy  Neurologic: A&O X 3 Psychiatric:  The pt has Normal affect.  CBC    Component Value Date/Time   WBC 8.1 01/23/2023 0347   RBC 2.97 (L) 01/23/2023 0347   HGB 8.9 (L) 01/23/2023 0347   HCT 27.7 (L) 01/23/2023 0347   PLT 407 (H) 01/23/2023 0347   MCV 93.3 01/23/2023 0347   MCH 30.0 01/23/2023 0347   MCHC 32.1 01/23/2023 0347   RDW 15.2 01/23/2023 0347   LYMPHSABS 0.8 01/22/2023 1447   MONOABS 0.7 01/22/2023 1447   EOSABS 0.3 01/22/2023 1447   BASOSABS 0.1 01/22/2023 1447    BMET    Component Value Date/Time   NA 137 01/23/2023 0347   K 3.2 (L) 01/23/2023 0347   CL 102 01/23/2023 0347   CO2 26 01/23/2023 0347   GLUCOSE 161 (H) 01/23/2023 0347   BUN 16 01/23/2023 0347   CREATININE 1.12 (H) 01/23/2023 0347   CALCIUM 8.0 (L) 01/23/2023 0347   GFRNONAA 47 (L) 01/23/2023 0347   GFRAA >60 06/25/2019 1406    COAGS: No results found for: "INR", "PROTIME"   Non-Invasive Vascular Imaging:   ABI 01/20/2023 ABI Findings:  +---------+------------------+-----+----------+--------+  Right   Rt Pressure (mmHg)IndexWaveform  Comment   +---------+------------------+-----+----------+--------+  Brachial                                  IV        +---------+------------------+-----+----------+--------+  PTA     247               1.56 monophasic          +---------+------------------+-----+----------+--------+  DP      130               0.82 monophasic          +---------+------------------+-----+----------+--------+  Great Toe51                0.32                     +---------+------------------+-----+----------+--------+   +---------+------------------+-----+----------+-------+  Left    Lt Pressure (mmHg)IndexWaveform   Comment  +---------+------------------+-----+----------+-------+  Brachial 158                    triphasic          +---------+------------------+-----+----------+-------+  PTA     237               1.50 monophasic         +---------+------------------+-----+----------+-------+  DP      254               1.61 monophasic         +---------+------------------+-----+----------+-------+  Great Toe62                0.39                    +---------+------------------+-----+----------+-------+   +-------+-----------+-----------+------------+------------+  ABI/TBIToday's ABIToday's TBIPrevious ABIPrevious TBI  +-------+-----------+-----------+------------+------------+  Right Watson         0.32                                 +-------+-----------+-----------+------------+------------+  Left  Crystal         0.39                                 +-------+-----------+-----------+------------+------------+  ASSESSMENT/PLAN: This is a 89 y.o. female with DM now with right heel ulceration   -discussed with pt that we could do arteriogram to evaluate blood flow in the right leg, but with that said, it is a very small chance of any healing of the heel ulceration.  She has been seen by Dr. Lajoyce Corners, who recommended right AKA.  Discussed that the heel ulcer can make her sick and be life threatening and that would certainly change the plan.  She lives independently and would like to consider all of her options.   -Dr. Randie Heinz to evaluate pt and determine further plan   Doreatha Massed, PA-C [facility] [phone]  I have independently interviewed and examined patient and agree with PA assessment and plan above.  I reviewed her noninvasive vascular study as well as her MRI and the pictures of her heel.  She has very foul-smelling necrosis of the heel which even with revascularization would require long term nonweightbearing with serial debridement and  still would be highly unlikely to heal given her underlying venous/lymphatic disease.  As such I have recommended against revascularization attempts and she would be best served with above-knee amputation. I discussed this with the patient and her grandsons at the bedside and they demonstrate good understanding.   C. Randie Heinz, MD [facility] [place] Office: [phone] Pager: [phone]

## 2023-01-23 NOTE — Progress Notes (Signed)
This chaplain responded to PMT NP-Dawn consult for creating/updating the Pt. Advance Directive. The Pt. is awake and sitting on the edge of the bed. The Pt. Shares she is experiencing arthritis pain in her knees. The Pt. grandson-Brad is visiting.  After HCPOA education, the Pt. chooses to follow [name] Hierarchy of Decision Makers and allow the Pt. two sons to be surrogate decision makers if the Pt. can not make her own medical decisions. The chaplain provided education on the importance of talking to her sons about her choices and decisions, if her voice is not available.  The Pt. declined completing a Living Will. The Pt. has plans to complete a Living Will with her lawyer.  The chaplain left the blank AD with the Pt.   This chaplain is available for F/U spiriutal care as needed.  Chaplain Stephanie Acre [phone]

## 2023-01-23 NOTE — Care Management Important Message (Signed)
Important Message  Patient Details  Name: Amber Dudley MRN: [id number] Date of Birth: 03-13-1931   Medicare Important Message Given:  Yes     Sherilyn Banker 01/23/2023, 1:13 PM

## 2023-01-24 DIAGNOSIS — Z7189 Other specified counseling: Secondary | ICD-10-CM | POA: Diagnosis not present

## 2023-01-24 DIAGNOSIS — Z794 Long term (current) use of insulin: Secondary | ICD-10-CM | POA: Diagnosis not present

## 2023-01-24 DIAGNOSIS — E1152 Type 2 diabetes mellitus with diabetic peripheral angiopathy with gangrene: Secondary | ICD-10-CM | POA: Diagnosis not present

## 2023-01-24 DIAGNOSIS — E11628 Type 2 diabetes mellitus with other skin complications: Secondary | ICD-10-CM | POA: Diagnosis not present

## 2023-01-24 DIAGNOSIS — E43 Unspecified severe protein-calorie malnutrition: Secondary | ICD-10-CM | POA: Diagnosis not present

## 2023-01-24 DIAGNOSIS — I10 Essential (primary) hypertension: Secondary | ICD-10-CM | POA: Diagnosis not present

## 2023-01-24 LAB — CBC
HCT: 28.2 % — ABNORMAL LOW (ref 36.0–46.0)
Hemoglobin: 9.1 g/dL — ABNORMAL LOW (ref 12.0–15.0)
MCH: 29.4 pg (ref 26.0–34.0)
MCHC: 32.3 g/dL (ref 30.0–36.0)
MCV: 91 fL (ref 80.0–100.0)
Platelets: 405 10*3/uL — ABNORMAL HIGH (ref 150–400)
RBC: 3.1 MIL/uL — ABNORMAL LOW (ref 3.87–5.11)
RDW: 15.2 % (ref 11.5–15.5)
WBC: 6.5 10*3/uL (ref 4.0–10.5)
nRBC: 0 % (ref 0.0–0.2)

## 2023-01-24 LAB — RENAL FUNCTION PANEL
Albumin: 2.2 g/dL — ABNORMAL LOW (ref 3.5–5.0)
Anion gap: 9 (ref 5–15)
BUN: 17 mg/dL (ref 8–23)
CO2: 28 mmol/L (ref 22–32)
Calcium: 8.4 mg/dL — ABNORMAL LOW (ref 8.9–10.3)
Chloride: 103 mmol/L (ref 98–111)
Creatinine, Ser: 1.12 mg/dL — ABNORMAL HIGH (ref 0.44–1.00)
GFR, Estimated: 47 mL/min — ABNORMAL LOW (ref >60)
Glucose, Bld: 91 mg/dL (ref 70–99)
Phosphorus: 3.3 mg/dL (ref 2.5–4.6)
Potassium: 3.8 mmol/L (ref 3.5–5.1)
Sodium: 140 mmol/L (ref 135–145)

## 2023-01-24 LAB — GLUCOSE, CAPILLARY
Glucose-Capillary: 76 mg/dL (ref 70–99)
Glucose-Capillary: 127 mg/dL — ABNORMAL HIGH (ref 70–99)
Glucose-Capillary: 150 mg/dL — ABNORMAL HIGH (ref 70–99)
Glucose-Capillary: 172 mg/dL — ABNORMAL HIGH (ref 70–99)

## 2023-01-24 LAB — CULTURE, BLOOD (ROUTINE X 2): Culture: NO GROWTH

## 2023-01-24 MED ORDER — MEDIHONEY WOUND/BURN DRESSING EX PSTE
1.0000 | PASTE | Freq: Every day | CUTANEOUS | Status: DC
  Administered 2023-01-24 – 2023-01-31 (×8): 1 via TOPICAL
  Filled 2023-01-24 (×2): qty 44

## 2023-01-24 MED ORDER — HYDRALAZINE HCL 10 MG PO TABS
10.0000 mg | ORAL_TABLET | Freq: Once | ORAL | Status: AC
  Administered 2023-01-24: 10 mg via ORAL
  Filled 2023-01-24: qty 1

## 2023-01-24 MED ORDER — POTASSIUM CHLORIDE CRYS ER 20 MEQ PO TBCR
20.0000 meq | EXTENDED_RELEASE_TABLET | Freq: Two times a day (BID) | ORAL | Status: DC
  Administered 2023-01-24 – 2023-01-31 (×15): 20 meq via ORAL
  Filled 2023-01-24 (×15): qty 1

## 2023-01-24 MED ORDER — INSULIN GLARGINE-YFGN 100 UNIT/ML ~~LOC~~ SOLN
30.0000 [IU] | Freq: Every day | SUBCUTANEOUS | Status: DC
  Administered 2023-01-24 – 2023-01-31 (×8): 30 [IU] via SUBCUTANEOUS
  Filled 2023-01-24 (×8): qty 0.3

## 2023-01-24 NOTE — Plan of Care (Signed)
  Problem: Coping: Goal: Ability to adjust to condition or change in health will improve Outcome: Progressing   Problem: Education: Goal: Ability to describe self-care measures that may prevent or decrease complications (Diabetes Survival Skills Education) will improve Outcome: Progressing Goal: Individualized Educational Video(s) Outcome: Progressing   Problem: Metabolic: Goal: Ability to maintain appropriate glucose levels will improve Outcome: Progressing   Problem: Nutritional: Goal: Maintenance of adequate nutrition will improve Outcome: Progressing Goal: Progress toward achieving an optimal weight will improve Outcome: Progressing

## 2023-01-24 NOTE — Consult Note (Addendum)
WOC Nurse Consult Note: Reason for Consult: Consult requested for right heel.  Performed remotely after review of progress notes and photos in the EMR.  WOC previously performed a consult on 7/19 for other wounds. Dr Lajoyce Corners of the ortho service performed a consult on 7/19 for the right heel and recommended amputation and the patient declined.  Vascular team assessed the wound on 7/22 and requested WOC nurse provide topical treatment recommendations. Pt has an inadequate ABI to promote healing and topical treatment will be minimally effective; right heel is necrotic with yellow slough and black eschar and mod amt tan foul-smelling drainage.  Wound type: Chronic full thickness wound Dressing procedure/placement/frequency: Float heel to reduce pressure.  Topical treatment instructions provided for bedside nurses to perform as follows: Apply Medihoney to right heel Q day, then cover with ABD pad and kerlex. Please re-consult if further assistance is needed.  Thank-you,  Cammie Mcgee MSN, RN, CWOCN, [place], CNS [phone]

## 2023-01-24 NOTE — Progress Notes (Signed)
Physical Therapy Treatment Patient Details Name: Amber Dudley MRN: [id number] DOB: 06/07/1931 Today's Date: 01/24/2023   History of Present Illness 89 year old woman presents to ED 7/18 after hitting R foot causing increased bleeding 2 days prior and onset of increased pain in weight bearing on R foot. Reports no lack of sensation or neuropathy but is unaware of needle embedded in R 5th metatarsal. Found to have chronic diabetic R foot ulcer. Xrays reveal soft tissue ulceration overlying plantar surface of calcaneus and subcutaneous gas but no focal bony erosions. Admitted for observation and development of plan of care. PMH:HTN, Insulin dependent T2DM, Hypothyroidism, and chronic lower extremity edema    PT Comments  Pt was able to ambulate an increased distance of up to ~160 ft in one bout this date. She reports feeling weaker than usual due to not mobilizing as often. Encouraged pt ambulate to/from bathroom with nursing staff and referred pt to mobility tech team. Will continue to follow acutely.     Assistance Recommended at Discharge Intermittent Supervision/Assistance  If plan is discharge home, recommend the following:  Can travel by private vehicle    A little help with walking and/or transfers;A little help with bathing/dressing/bathroom;Assistance with cooking/housework;Assist for transportation;Help with stairs or ramp for entrance   Yes  Equipment Recommendations  None recommended by PT    Recommendations for Other Services       Precautions / Restrictions Precautions Precautions: Fall Precaution Comments: fall 2 year prior Restrictions Weight Bearing Restrictions: No     Mobility  Bed Mobility               General bed mobility comments: Pt sitting up in recliner upon arrival    Transfers Overall transfer level: Needs assistance Equipment used: Rolling walker (2 wheels) Transfers: Sit to/from Stand Sit to Stand: Min guard           General transfer  comment: Pt able to stand from recliner 2x with min guard for safety    Ambulation/Gait Ambulation/Gait assistance: Min guard Gait Distance (Feet): 160 Feet Assistive device: Rolling walker (2 wheels) Gait Pattern/deviations: Knee flexed in stance - right, Knee flexed in stance - left, Decreased step length - right, Decreased step length - left, Shuffle, Trunk flexed, Narrow base of support, Decreased weight shift to right Gait velocity: slowed Gait velocity interpretation: <1.31 ft/sec, indicative of household ambulator   General Gait Details: Pt ambulating laps in the room with poor feet clearance and narrow BOS. No LOB, min guard for safety   Stairs             Wheelchair Mobility     Tilt Bed    Modified Rankin (Stroke Patients Only)       Balance Overall balance assessment: Needs assistance Sitting-balance support: Feet supported, No upper extremity supported Sitting balance-Leahy Scale: Fair     Standing balance support: During functional activity, Reliant on assistive device for balance, Bilateral upper extremity supported Standing balance-Leahy Scale: Poor Standing balance comment: Reliant on RW to ambulate                            Cognition Arousal/Alertness: Awake/alert Behavior During Therapy: WFL for tasks assessed/performed Overall Cognitive Status: Within Functional Limits for tasks assessed  Exercises      General Comments        Pertinent Vitals/Pain Pain Assessment Pain Assessment: Faces Faces Pain Scale: Hurts a little bit Pain Location: R foot Pain Descriptors / Indicators: Discomfort, Guarding Pain Intervention(s): Limited activity within patient's tolerance, Monitored during session, Repositioned    Home Living                          Prior Function            PT Goals (current goals can now be found in the care plan section) Acute Rehab PT  Goals Patient Stated Goal: to be independent PT Goal Formulation: With patient Time For Goal Achievement: 02/03/23 Potential to Achieve Goals: Fair Progress towards PT goals: Progressing toward goals    Frequency    Min 1X/week      PT Plan Frequency needs to be updated    Co-evaluation              AM-PAC PT "6 Clicks" Mobility   Outcome Measure  Help needed turning from your back to your side while in a flat bed without using bedrails?: None Help needed moving from lying on your back to sitting on the side of a flat bed without using bedrails?: A Little Help needed moving to and from a bed to a chair (including a wheelchair)?: A Little Help needed standing up from a chair using your arms (e.g., wheelchair or bedside chair)?: A Little Help needed to walk in hospital room?: A Little Help needed climbing 3-5 steps with a railing? : A Lot 6 Click Score: 18    End of Session Equipment Utilized During Treatment: Gait belt Activity Tolerance: Patient tolerated treatment well Patient left: in chair;with call bell/phone within reach;with nursing/sitter in room Nurse Communication: Mobility status (NT) PT Visit Diagnosis: Unsteadiness on feet (R26.81);Other abnormalities of gait and mobility (R26.89);Muscle weakness (generalized) (M62.81);Difficulty in walking, not elsewhere classified (R26.2);Pain Pain - Right/Left: Right Pain - part of body: Ankle and joints of foot     Time: 4132-4401 PT Time Calculation (min) (ACUTE ONLY): 22 min  Charges:    $Gait Training: 8-22 mins PT General Charges $$ ACUTE PT VISIT: 1 Visit                     Amber Dudley, PT, DPT Acute Rehabilitation Services  Office: [phone]    Amber Dudley 01/24/2023, 5:29 PM

## 2023-01-24 NOTE — Progress Notes (Signed)
Subjective:  Amber Dudley is a pleasant 89 year old woman with a past medical history of HTN, Insulin dependent T2DM, Hypothyroidism, and chronic lower extremity edema who is admitted for further evaluation and treatment of a right calcaneal subacute wet gangrenous diabetic ulcer. MRI showed Large plantar heel ulceration with internal gas and hypo-enhancement measuring about 3.0 by 4.5 by 4.2 cm without osteomyelitis. Orthopedic surgery recommended AKA to which the patient is undecided/reluctant at the moment. Palliative care was consulted to discuss goals of care to guide her with therapy choices based on her goals. Vascular surgery does not believe that she is a candidate for arteriogram.   Today, she reported feeling well and that she is still trying to decide what treatment plan she would like. After speaking with her, she is leaning towards hospice but would like to discuss this with her son first. Her son will be here later this afternoon.   Objective:  Vital signs in last 24 hours: Vitals:   01/23/23 1901 01/23/23 2053 01/24/23 0508 01/24/23 0714  BP: (!) 121/51 (!) 152/72 (!) 164/103 (!) 155/78  Pulse: 73 76 74 78  Resp: 18 18 18 18   Temp:  98.1 F (36.7 C) 97.6 F (36.4 C) 97.7 F (36.5 C)  TempSrc:    Oral  SpO2: 95% 98% 97% 94%  Weight:      Height:       Physical Exam: General:Sitting in bed, conversing  Cardiac:RRR, no murmurs Pulmonary:Clear breath sounds b/l, normal work of breathing  MSK: R gangrene ulcer is covered with dressing, b/l edema and discoloration to the level of the knees unchanged  Skin: warm and dry Neuro: alert Psych: normal mood and affect      Latest Ref Rng & Units 01/24/2023    1:34 AM 01/23/2023    3:47 AM 01/22/2023    2:47 PM  CBC  WBC 4.0 - 10.5 K/uL 6.5  8.1  8.9   Hemoglobin 12.0 - 15.0 g/dL 9.1  8.9  69.4   Hematocrit 36.0 - 46.0 % 28.2  27.7  33.0   Platelets 150 - 400 K/uL 405  407  473        Latest Ref Rng & Units 01/24/2023     1:34 AM 01/23/2023    3:47 AM 01/22/2023    2:47 PM  CMP  Glucose 70 - 99 mg/dL 91  854  627   BUN 8 - 23 mg/dL 17  16  17    Creatinine 0.44 - 1.00 mg/dL 0.35  0.09  3.81   Sodium 135 - 145 mmol/L 140  137  134   Potassium 3.5 - 5.1 mmol/L 3.8  3.2  3.3   Chloride 98 - 111 mmol/L 103  102  97   CO2 22 - 32 mmol/L 28  26  26    Calcium 8.9 - 10.3 mg/dL 8.4  8.0  8.4     Assessment/Plan:  Principal Problem:   Diabetic wet gangrene of the foot (HCC) Active Problems:   Hypothyroidism   Uncontrolled type 2 diabetes mellitus with hyperglycemia (HCC)   Diabetic infection of right foot (HCC)   Severe protein-calorie malnutrition (HCC)   Goals of care, counseling/discussion   Right diabetic gangrenous ulcer right heel MRI showed large plantar heel ulceration with internal gas and hypo enhancement measuring about 3.0 by 4.5 by 4.2 cm without osteomyelitis.  Patient is currently leaning towards no amputation and considering hospice care at the moment. She would like to speak  with her son later today and try to make a decision. She is aware of her decisions and is also aware that we will offer her support for the treatment option she chooses.  -Vascular surgery does not believe she is an arteriogram candidate, we appreciated the consult  -Continue IV daptomycin and Zosyn  -Continue following with palliative care -Will reach out to social work and obtain resources on hospice care once patient officially decides her course of treatment       T2DM -Increased basal insulin to 30 units due to patient requiring an additional 13 units of SSI -Will titrate if needed -Hold metformin    HTN -Amlodipine increased to 10 mg -Toprol -XL increased to 100 mg -Continued to increase, added 10 mg oral hydralazine this morning    Hypothyroidism -Levothyroxine 68.    Delirium Precautions-continue      Prior to Admission Living Arrangement:Home Anticipated Discharge Location:Home or hospice,  will be deciding with patient  Barriers to Discharge:Medical treatment  Dispo: Anticipated discharge in approximately 1-2 day(s).   Faith Rogue, DO 01/24/2023, 11:39 AM Pager: [phone] After 5pm on weekdays and 1pm on weekends: On Call pager [phone]

## 2023-01-24 NOTE — Plan of Care (Signed)

## 2023-01-24 NOTE — Progress Notes (Signed)
Patient ID: Amber Dudley, female   DOB: 03/15/1931, 89 y.o.   MRN: [id number]    Progress Note from the Palliative Medicine Team at [facility] : [facility]   Patient Name: Amber Dudley        Date: 01/24/2023 DOB: 02/09/1931  Age: 89 y.o. MRN#: [id number] Attending Physician: Reymundo Poll, MD Primary Care Physician: Garald Braver Admit Date: 01/19/2023    Extensive chart review has been completed prior to meeting with patient/family  including labs, vital signs, imaging, progress/consult notes, orders, medications and available advance directive documents.   89 y.o. female  with past medical history of diabetic neuropathy, HTN, Insulin dependent T2DM, Hypothyroidism, and chronic lower extremity edema admitted on 01/19/2023 for further evaluation and treatment of a right calcaneal subacute wet gangrenous diabetic ulcer.    Initial palliative medicine consult 01/21/2023   The last time she saw her PCP was about 4-6 months ago: Mariana Kaufman NP.   ED Course: Patient presented with chronic appearing right diabetic foot ulcer. On Xray, it shows soft tissue ulceration overlying the plantar surface of the calcaneus with overlying bandage material and subcutaneous gas along but no focal bony erosions to suggest osteomyelitis. She remained afebrile and does not have leukocytosis. She was admitted to the IMTS for evaluation and treatment.     This NP assessed patient at the bedside as a follow up palliative medicine needs and emotional support.  I met Ms. Amber Dudley at her bedside.  She is alert and oriented and does not to converse with.   She is a mother of 3 children, her daughter died about 3 years ago from a cancer diagnosis.  She is looking forward to her son how coming from [place] to be with her, they arrive sometime this afternoon.  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    Education offered on the seriousness  of her current medical situation specific to the right heel ulceration and recommendation for amputation for a right AKA.  She is struggling with treatment option decisions.  Above all she values her independence, "I do not want to be a burden".  Education offered on the difference between a full medical support past attempting to prolong life versus a comfort quality path allowing for natural death.  Again her son is coming to town and this will give them a good opportunity to talk amongst themselves regarding treatment option decisions and just as importantly anticipatory care needs.          Patient leans to DNR/DNI but again we will discuss and clarify when her son is here tomorrow.  Plan is to meet tomorrow morning at bedside at 11 AM with son   Questions and concerns addressed   Discussed with Dr Hessie Diener DO via secure chat  Time: 55 minutes  Detailed review of medical records ( labs, imaging, vital signs), medically appropriate exam ( MS, skin, resp)   discussed with treatment team, counseling and education to patient, family, staff, documenting clinical information, medication management, coordination of care    Lorinda Creed NP  Palliative Medicine Team Team Phone # [phone] Pager [phone]

## 2023-01-25 DIAGNOSIS — Z794 Long term (current) use of insulin: Secondary | ICD-10-CM | POA: Diagnosis not present

## 2023-01-25 DIAGNOSIS — E1152 Type 2 diabetes mellitus with diabetic peripheral angiopathy with gangrene: Secondary | ICD-10-CM | POA: Diagnosis not present

## 2023-01-25 DIAGNOSIS — E1165 Type 2 diabetes mellitus with hyperglycemia: Secondary | ICD-10-CM

## 2023-01-25 DIAGNOSIS — I10 Essential (primary) hypertension: Secondary | ICD-10-CM | POA: Diagnosis not present

## 2023-01-25 DIAGNOSIS — Z7189 Other specified counseling: Secondary | ICD-10-CM | POA: Diagnosis not present

## 2023-01-25 DIAGNOSIS — E11628 Type 2 diabetes mellitus with other skin complications: Secondary | ICD-10-CM | POA: Diagnosis not present

## 2023-01-25 LAB — CBC
HCT: 30.5 % — ABNORMAL LOW (ref 36.0–46.0)
Hemoglobin: 9.7 g/dL — ABNORMAL LOW (ref 12.0–15.0)
MCH: 30 pg (ref 26.0–34.0)
MCHC: 31.8 g/dL (ref 30.0–36.0)
MCV: 94.4 fL (ref 80.0–100.0)
Platelets: 459 10*3/uL — ABNORMAL HIGH (ref 150–400)
RBC: 3.23 MIL/uL — ABNORMAL LOW (ref 3.87–5.11)
RDW: 15.8 % — ABNORMAL HIGH (ref 11.5–15.5)
WBC: 5.6 10*3/uL (ref 4.0–10.5)
nRBC: 0 % (ref 0.0–0.2)

## 2023-01-25 LAB — RENAL FUNCTION PANEL
Albumin: 2.3 g/dL — ABNORMAL LOW (ref 3.5–5.0)
Anion gap: 9 (ref 5–15)
BUN: 16 mg/dL (ref 8–23)
CO2: 26 mmol/L (ref 22–32)
Calcium: 8.4 mg/dL — ABNORMAL LOW (ref 8.9–10.3)
Chloride: 105 mmol/L (ref 98–111)
Creatinine, Ser: 1.13 mg/dL — ABNORMAL HIGH (ref 0.44–1.00)
GFR, Estimated: 46 mL/min — ABNORMAL LOW (ref >60)
Glucose, Bld: 66 mg/dL — ABNORMAL LOW (ref 70–99)
Phosphorus: 3.7 mg/dL (ref 2.5–4.6)
Potassium: 3.8 mmol/L (ref 3.5–5.1)
Sodium: 140 mmol/L (ref 135–145)

## 2023-01-25 LAB — GLUCOSE, CAPILLARY
Glucose-Capillary: 121 mg/dL — ABNORMAL HIGH (ref 70–99)
Glucose-Capillary: 59 mg/dL — ABNORMAL LOW (ref 70–99)
Glucose-Capillary: 132 mg/dL — ABNORMAL HIGH (ref 70–99)
Glucose-Capillary: 225 mg/dL — ABNORMAL HIGH (ref 70–99)
Glucose-Capillary: 288 mg/dL — ABNORMAL HIGH (ref 70–99)

## 2023-01-25 MED ORDER — HYDRALAZINE HCL 10 MG PO TABS: 10.0000 mg | ORAL_TABLET | Freq: Once | ORAL | Status: DC

## 2023-01-25 MED ORDER — HYDRALAZINE HCL 10 MG PO TABS
10.0000 mg | ORAL_TABLET | Freq: Once | ORAL | Status: AC
  Administered 2023-01-25: 10 mg via ORAL
  Filled 2023-01-25: qty 1

## 2023-01-25 NOTE — Inpatient Diabetes Management (Signed)
Inpatient Diabetes Program Recommendations  AACE/ADA: New Consensus Statement on Inpatient Glycemic Control (2015)  Target Ranges:  Prepandial:   less than 140 mg/dL      Peak postprandial:   less than 180 mg/dL (1-2 hours)      Critically ill patients:  140 - 180 mg/dL   Lab Results  Component Value Date   GLUCAP 121 (H) 01/25/2023   HGBA1C 8.1 (H) 01/19/2023    Review of Glycemic Control  Latest Reference Range & Units 01/24/23 07:06 01/24/23 10:59 01/24/23 16:14 01/24/23 21:16 01/25/23 07:07 01/25/23 07:29  Glucose-Capillary 70 - 99 mg/dL 76 440 (H) 102 (H) 725 (H) 59 (L) 121 (H)   Diabetes history: DM 2 Outpatient Diabetes medications: Basaglar 15 units qam, 20 units qpm, metformin 1000 mg bid Current orders for Inpatient glycemic control:  Semglee 30 units Daily Novolog 0-15 units tid + hs  A1c 8.1% on 7/18  Inpatient Diabetes Program Recommendations:    Yesterday fasting glucose in the 70's on 25 units of Semglee.   -  Reduce Semglee back down to 25 units -  If glucose trends increase throughout the day pt will need Novolog meal coverage to prevent postprandial spikes  Thanks, Christena Deem RN, MSN, BC-ADM Inpatient Diabetes Coordinator Team Pager [phone] (8a-5p)

## 2023-01-25 NOTE — Progress Notes (Signed)
Patient ID: JALEE SAINE, female   DOB: February 06, 1931, 89 y.o.   MRN: [id number]    Progress Note from the Palliative Medicine Team at [facility]   Patient Name: Amber Dudley        Date: 01/25/2023 DOB: [birth year]  Age: 89 y.o. MRN#: [id number] Attending Physician: Reymundo Poll, MD Primary Care Physician: Garald Braver Admit Date: 01/19/2023    Extensive chart review has been completed prior to meeting with patient/family  including labs, vital signs, imaging, progress/consult notes, orders, medications and available advance directive documents.   89 y.o. female  with past medical history of diabetic neuropathy, HTN, Insulin dependent T2DM, Hypothyroidism, and chronic lower extremity edema admitted on 01/19/2023 for further evaluation and treatment of a right calcaneal subacute wet gangrenous diabetic ulcer.    Initial palliative medicine consult 01/21/2023   The last time she saw her PCP was about 4-6 months ago: Mariana Kaufman NP.   ED Course: Patient presented with chronic appearing right diabetic foot ulcer. On Xray, it shows soft tissue ulceration overlying the plantar surface of the calcaneus with overlying bandage material and subcutaneous gas along but no focal bony erosions to suggest osteomyelitis. She remained afebrile and does not have leukocytosis. She was admitted to the IMTS for evaluation and treatment.     This NP assessed patient at the bedside as a follow up palliative medicine needs and emotional support and to met and to scheduled    I met Ms. Danyale Lamison at her bedside.       Education offered on the seriousness of her current medical situation specific to the right heel ulceration and recommendation for amputation for a right AKA.  She is struggling with treatment option decisions.  Above all she values her independence, "I do not want to be a burden".     Education offered on the difference between a full medical support past attempting to prolong life  versus a comfort quality path allowing for natural death.  Again her son is coming to town and this will give them a good opportunity to talk amongst themselves regarding treatment option decisions and just as importantly anticipatory care needs.               Questions and concerns addressed   Discussed with Dr Hessie Diener DO via secure chat  Time: 90 minutes  Detailed review of medical records ( labs, imaging, vital signs), medically appropriate exam ( MS, skin, resp)   discussed with treatment team, counseling and education to patient, family, staff, documenting clinical information, medication management, coordination of care    Lorinda Creed NP  Palliative Medicine Team Team Phone # [phone] Pager [phone]

## 2023-01-25 NOTE — Progress Notes (Signed)
   Subjective:  Amber Dudley is a pleasant 89 year old woman with a past medical history of HTN, Insulin dependent T2DM, Hypothyroidism, and chronic lower extremity edema who is admitted for further evaluation and treatment of a right calcaneal subacute wet gangrenous diabetic ulcer. MRI showed Large plantar heel ulceration with internal gas and hypo-enhancement measuring about 3.0 by 4.5 by 4.2 cm without osteomyelitis. Orthopedic surgery recommended AKA to which the patient is undecided at the moment. Palliative care will have a goals of care conversation this morning and we will proceed from there.   She reported feeling good this morning. She has a good appetite and reported taking a nap this morning. She is still thinking about her decision. Her son is in town and will be at the meeting at 41.  Objective:  Vital signs in last 24 hours: Vitals:   01/24/23 1616 01/24/23 1937 01/25/23 0407 01/25/23 0713  BP: (!) 151/71 133/75 (!) 143/76 (!) 162/80  Pulse: 69 71 80 75  Resp: 17 20 20 18   Temp: 98 F (36.7 C) 98.2 F (36.8 C) 98.5 F (36.9 C) 97.6 F (36.4 C)  TempSrc: Oral   Oral  SpO2: 95% 96% 96% 95%  Weight:      Height:       Physical Exam:  General:Sitting in bed, pleasant, conversing  Cardiac:RRR, no murmurs Pulmonary:Clear bilaterally to auscultate, normal work of breathing observed  MSK: Malodorous right calcaneal ulcer, covered with dressing and socks, b/l edema and discoloration to the level of the knees, unchanged Skin:warm and dry Neuro: Oriented Psych: Normal mood and affect  Assessment/Plan:  Principal Problem:   Diabetic wet gangrene of the foot (HCC) Active Problems:   Hypothyroidism   Uncontrolled type 2 diabetes mellitus with hyperglycemia (HCC)   Diabetic infection of right foot (HCC)   Severe protein-calorie malnutrition (HCC)   Goals of care, counseling/discussion  Right diabetic gangrenous ulcer right heel Goals of Care  MRI showed large plantar  heel ulceration with internal gas and hypo enhancement measuring about 3.0 by 4.5 by 4.2 cm without osteomyelitis.  Family meeting with palliative care at 11 am to discuss goals of care. -Will continue zosyn and daptomycin in the meantime  -Will follow up on patient and families wishes after goals of care conversation   T2DM -Basal insulin 30 units -Hold metformin  HTN Patient responded to 10 mg of hydralazine, will continue today  -Continue Toprol XL 100 mg and amlodipine 10mg    Hypothyroidism -Levothyroxine 68.   Prior to Admission Living Arrangement:home Anticipated Discharge Location: home Barriers to Discharge:Medical treatment  Dispo: Anticipated discharge in approximately 1-2 day(s).   Faith Rogue, DO 01/25/2023, 10:06 AM Pager: [phone] After 5pm on weekdays and 1pm on weekends: On Call pager [phone]

## 2023-01-25 NOTE — Plan of Care (Signed)

## 2023-01-25 NOTE — Progress Notes (Signed)
Occupational Therapy Treatment Patient Details Name: Amber Dudley MRN: [id number] DOB: 1931-01-04 Today's Date: 01/25/2023   History of present illness 89 year old woman presents to ED 7/18 after hitting R foot causing increased bleeding 2 days prior and onset of increased pain in weight bearing on R foot. Reports no lack of sensation or neuropathy but is unaware of needle embedded in R 5th metatarsal. Found to have chronic diabetic R foot ulcer. Xrays reveal soft tissue ulceration overlying plantar surface of calcaneus and subcutaneous gas but no focal bony erosions. Admitted for observation and development of plan of care. PMH:HTN, Insulin dependent T2DM, Hypothyroidism, and chronic lower extremity edema   OT comments  Pt continuing to progress towards pt focused goals, pt completing standing bADLs and ambulation with min guard assist. She displays decreased safety awareness and knowledge of RW use as she attempts to ambulate without it sometimes. Pt also noted to have decreased activity tolerance, got SOB during mobility and reports she use to be able to go further, seated rest break needed to recover. OT to continue to progress pt as able, DC plans remain appropriate for SNF   Recommendations for follow up therapy are one component of a multi-disciplinary discharge planning process, led by the attending physician.  Recommendations may be updated based on patient status, additional functional criteria and insurance authorization.    Assistance Recommended at Discharge Set up Supervision/Assistance  Patient can return home with the following  A little help with walking and/or transfers;A little help with bathing/dressing/bathroom;Help with stairs or ramp for entrance;Assist for transportation   Equipment Recommendations  BSC/3in1    Recommendations for Other Services      Precautions / Restrictions Precautions Precautions: Fall Precaution Comments: fall 2 year prior Restrictions Weight  Bearing Restrictions: No       Mobility Bed Mobility               General bed mobility comments: Pt rec'd and left sitting in recliner    Transfers Overall transfer level: Needs assistance Equipment used: Rolling walker (2 wheels) Transfers: Sit to/from Stand Sit to Stand: Min guard           General transfer comment: Pt needing Mod A for STS from low EOB with steadying assist, Min guard from recliner     Balance Overall balance assessment: Needs assistance Sitting-balance support: Feet supported, No upper extremity supported Sitting balance-Leahy Scale: Fair     Standing balance support: During functional activity, Reliant on assistive device for balance, Bilateral upper extremity supported Standing balance-Leahy Scale: Poor                             ADL either performed or assessed with clinical judgement   ADL Overall ADL's : Needs assistance/impaired     Grooming: Standing;Wash/dry hands;Oral care;Min guard Grooming Details (indicate cue type and reason): Cues for RW safety                             Functional mobility during ADLs: Min guard;Rolling walker (2 wheels) General ADL Comments: Pt completing hall level ambulation ~33ft    Extremity/Trunk Assessment              Vision       Perception     Praxis      Cognition Arousal/Alertness: Awake/alert Behavior During Therapy: WFL for tasks assessed/performed Overall Cognitive Status: Impaired/Different from  baseline Area of Impairment: Safety/judgement                         Safety/Judgement: Decreased awareness of safety     General Comments: Decreased knowledge of RW safety with use        Exercises      Shoulder Instructions       General Comments VSS on RA, Pt noted to be SOB following hall ambulation rest break given and Sp02 noted at 97%    Pertinent Vitals/ Pain       Pain Assessment Pain Assessment: Faces Faces Pain Scale:  Hurts a little bit Pain Location: R foot Pain Descriptors / Indicators: Discomfort, Guarding Pain Intervention(s): Monitored during session, Repositioned  Home Living                                          Prior Functioning/Environment              Frequency  Min 1X/week        Progress Toward Goals  OT Goals(current goals can now be found in the care plan section)  Progress towards OT goals: Progressing toward goals  Acute Rehab OT Goals Patient Stated Goal: To keep doing better OT Goal Formulation: With patient Time For Goal Achievement: 02/03/23 Potential to Achieve Goals: Good  Plan Discharge plan remains appropriate;Frequency remains appropriate    Co-evaluation                 AM-PAC OT "6 Clicks" Daily Activity     Outcome Measure   Help from another person eating meals?: None Help from another person taking care of personal grooming?: A Little Help from another person toileting, which includes using toliet, bedpan, or urinal?: A Little Help from another person bathing (including washing, rinsing, drying)?: A Little Help from another person to put on and taking off regular upper body clothing?: A Little Help from another person to put on and taking off regular lower body clothing?: A Little 6 Click Score: 19    End of Session Equipment Utilized During Treatment: Gait belt;Rolling walker (2 wheels)  OT Visit Diagnosis: Muscle weakness (generalized) (M62.81)   Activity Tolerance Patient tolerated treatment well   Patient Left in chair;with call bell/phone within reach;with family/visitor present   Nurse Communication Mobility status        Time: 2536-6440 OT Time Calculation (min): 23 min  Charges: OT General Charges $OT Visit: 1 Visit OT Treatments $Self Care/Home Management : 8-22 mins $Therapeutic Activity: 8-22 mins  01/25/2023  AB, OTR/L  Acute Rehabilitation Services  Office: [phone]   Tristan Schroeder 01/25/2023, 4:59 PM

## 2023-01-26 DIAGNOSIS — E1152 Type 2 diabetes mellitus with diabetic peripheral angiopathy with gangrene: Secondary | ICD-10-CM | POA: Diagnosis not present

## 2023-01-26 DIAGNOSIS — I10 Essential (primary) hypertension: Secondary | ICD-10-CM | POA: Diagnosis not present

## 2023-01-26 DIAGNOSIS — E11628 Type 2 diabetes mellitus with other skin complications: Secondary | ICD-10-CM | POA: Diagnosis not present

## 2023-01-26 DIAGNOSIS — Z7189 Other specified counseling: Secondary | ICD-10-CM | POA: Diagnosis not present

## 2023-01-26 DIAGNOSIS — Z794 Long term (current) use of insulin: Secondary | ICD-10-CM | POA: Diagnosis not present

## 2023-01-26 DIAGNOSIS — L089 Local infection of the skin and subcutaneous tissue, unspecified: Secondary | ICD-10-CM | POA: Diagnosis not present

## 2023-01-26 LAB — CBC
HCT: 29.8 % — ABNORMAL LOW (ref 36.0–46.0)
Hemoglobin: 9.6 g/dL — ABNORMAL LOW (ref 12.0–15.0)
MCV: 94 fL (ref 80.0–100.0)
Platelets: 450 10*3/uL — ABNORMAL HIGH (ref 150–400)
RBC: 3.17 MIL/uL — ABNORMAL LOW (ref 3.87–5.11)
RDW: 15.7 % — ABNORMAL HIGH (ref 11.5–15.5)
nRBC: 0 % (ref 0.0–0.2)

## 2023-01-26 LAB — RENAL FUNCTION PANEL
Albumin: 2.3 g/dL — ABNORMAL LOW (ref 3.5–5.0)
Anion gap: 11 (ref 5–15)
BUN: 16 mg/dL (ref 8–23)
CO2: 24 mmol/L (ref 22–32)
Calcium: 8.7 mg/dL — ABNORMAL LOW (ref 8.9–10.3)
Chloride: 105 mmol/L (ref 98–111)
Creatinine, Ser: 1.04 mg/dL — ABNORMAL HIGH (ref 0.44–1.00)
GFR, Estimated: 51 mL/min — ABNORMAL LOW (ref >60)
Glucose, Bld: 82 mg/dL (ref 70–99)
Phosphorus: 3.4 mg/dL (ref 2.5–4.6)
Potassium: 4.3 mmol/L (ref 3.5–5.1)
Sodium: 140 mmol/L (ref 135–145)

## 2023-01-26 LAB — GLUCOSE, CAPILLARY
Glucose-Capillary: 82 mg/dL (ref 70–99)
Glucose-Capillary: 128 mg/dL — ABNORMAL HIGH (ref 70–99)
Glucose-Capillary: 177 mg/dL — ABNORMAL HIGH (ref 70–99)
Glucose-Capillary: 163 mg/dL — ABNORMAL HIGH (ref 70–99)

## 2023-01-26 MED ORDER — HYDRALAZINE HCL 10 MG PO TABS
10.0000 mg | ORAL_TABLET | Freq: Once | ORAL | Status: AC
  Administered 2023-01-26: 10 mg via ORAL
  Filled 2023-01-26: qty 1

## 2023-01-26 NOTE — TOC Initial Note (Signed)
Transition of Care [facility]) - Initial/Assessment Note    Patient Details  Name: Amber Dudley MRN: [id number] Date of Birth: [birth year]  Transition of Care [facility]) CM/SW Contact:    Epifanio Lesches, RN Phone Number: 01/26/2023, 4:10 PM  Clinical Narrative:            -Diabetic wet gangrene of the foot   From home alone.       NCM spoke with son regarding disposition to home with health care services. Pt agreeable to home health services. Pt without preference.Referral made with [facility] and accepted. Son states pt will have 24/7  family/ friend support supervision once d/c. Pt without DME needs.Has W/C, RW @ home. Pt with transportation to home. Pt without RX med concerns.  TOC team following  and will assist with needs....  Expected Discharge Plan: Home w Home Health Services Barriers to Discharge: Continued Medical Work up   Patient Goals and CMS Choice Patient states their goals for this hospitalization and ongoing recovery are:: return home CMS Medicare.gov Compare Post Acute Care list provided to:: Patient Choice offered to / list presented to : Patient [place] ownership interest in [facility].provided to:: Patient    Expected Discharge Plan and Services       Living arrangements for the past 2 months: Single Family Home                           HH Arranged: RN, PT, OT, Patient Refused HH, Social Work HH Agency: [facility] Date [facility] Agency Contacted: 01/26/23 Time HH Agency Contacted: 1607 Representative spoke with at [facility] Agency: Kandee Keen  Prior Living Arrangements/Services Living arrangements for the past 2 months: Single Family Home Lives with:: Self Patient language and need for interpreter reviewed:: Yes Do you feel safe going back to the place where you live?: Yes      Need for Family Participation in Patient Care: No (Comment) Care giver support system in place?: Yes (comment) Current home services: DME Criminal  Activity/Legal Involvement Pertinent to Current Situation/Hospitalization: No - Comment as needed  Activities of Daily Living      Permission Sought/Granted Permission sought to share information with : Facility Industrial/product designer granted to share information with : Yes, Verbal Permission Granted  Share Information with NAME: SNF           Emotional Assessment Appearance:: Appears stated age Attitude/Demeanor/Rapport: Engaged Affect (typically observed): Accepting Orientation: : Oriented to Self, Oriented to Place, Oriented to  Time, Oriented to Situation      Admission diagnosis:  Diabetic infection of right foot (HCC) [I69.629, L08.9] Diabetic wet gangrene of the foot [facility]) [E11.52] Patient Active Problem List   Diagnosis Date Noted   Goals of care, counseling/discussion 01/23/2023   Hypothyroidism 01/20/2023   Uncontrolled type 2 diabetes mellitus with hyperglycemia (HCC) 01/20/2023   Diabetic infection of right foot (HCC) 01/20/2023   Severe protein-calorie malnutrition (HCC) 01/20/2023   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   Uncontrolled type 2 diabetes mellitus without complication, with long-term current use of insulin 07/01/2015   Essential hypertension, benign 07/01/2015   Hyperlipidemia 07/01/2015   PCP:  Garald Braver Pharmacy:   [facility] [address], [place] - [address] [place] [address] Phone: [phone] Fax: [phone]  [id number] [place], [place] - [address] AT [place] [facility] [place] [address] [place] [address] Phone:  [phone] Fax: [phone]     Social Determinants of Health (SDOH) Social History: SDOH Screenings   Tobacco Use: Low Risk  (01/19/2023)   SDOH Interventions:     Readmission Risk Interventions     No data to display

## 2023-01-26 NOTE — Progress Notes (Signed)
Subjective:   Amber Dudley is a pleasant 89 year old woman with a past medical history of HTN, Insulin dependent T2DM, Hypothyroidism, and chronic lower extremity edema who is admitted for further evaluation and treatment of a right calcaneal subacute wet gangrenous diabetic ulcer. MRI showed Large plantar heel ulceration with internal gas and hypo-enhancement measuring about 3.0 by 4.5 by 4.2 cm without osteomyelitis.   Today, she stated that she is still thinking about the decision. She has no complaints at this time. We spoke to the son in the hallway. He understood the risks and benefits of the AKA vs going home with hospice. They are going to talk some more and speak with Amber Dudley from palliative care at 11am.    Objective:  Vital signs in last 24 hours: Vitals:   01/25/23 1455 01/25/23 1912 01/26/23 0543 01/26/23 1007  BP: 125/69 (!) 145/70 (!) 170/69 138/63  Pulse: 69 71 85   Resp: 17 17 18    Temp: 98.1 F (36.7 C) 98.4 F (36.9 C) 98.2 F (36.8 C)   TempSrc: Oral  Oral   SpO2: 98% 95% 96%   Weight:      Height:      Physical Exam: General: Elderly appearing female, sitting on the bed, NAD Cardiac:RRR, no murmur Pulmonary: normal work of breathing observed  MSK: malodorous R gangrene ulcer covered, b/l edema and discoloration to the level of the knees: still unchanged  Skin: warm and dry Neuro: alert and oriented      Latest Ref Rng & Units 01/26/2023    7:37 AM 01/25/2023    5:57 AM 01/24/2023    1:34 AM  CBC  WBC 4.0 - 10.5 K/uL 6.0  5.6  6.5   Hemoglobin 12.0 - 15.0 g/dL 9.6  9.7  9.1   Hematocrit 36.0 - 46.0 % 29.8  30.5  28.2   Platelets 150 - 400 K/uL 450  459  405        Latest Ref Rng & Units 01/26/2023    7:37 AM 01/25/2023    5:57 AM 01/24/2023    1:34 AM  CMP  Glucose 70 - 99 mg/dL 82  66  91   BUN 8 - 23 mg/dL 16  16  17    Creatinine 0.44 - 1.00 mg/dL 2.13  0.86  5.78   Sodium 135 - 145 mmol/L 140  140  140   Potassium 3.5 - 5.1 mmol/L 4.3  3.8  3.8    Chloride 98 - 111 mmol/L 105  105  103   CO2 22 - 32 mmol/L 24  26  28    Calcium 8.9 - 10.3 mg/dL 8.7  8.4  8.4     Assessment/Plan:  Principal Problem:   Diabetic wet gangrene of the foot (HCC) Active Problems:   Hypothyroidism   Uncontrolled type 2 diabetes mellitus with hyperglycemia (HCC)   Diabetic infection of right foot (HCC)   Severe protein-calorie malnutrition (HCC)   Goals of care, counseling/discussion  Right diabetic gangrenous ulcer right heel Goals of Care  MRI showed large plantar heel ulceration with internal gas and hypo enhancement measuring about 3.0 by 4.5 by 4.2 cm without osteomyelitis.  -Family meeting with palliative care at 11 am again today to discuss goals of care. -Discontinued zosyn and daptomycin yesterday evening  -No leukocytosis and afebrile, will continue to monitor  -Will follow up on patient and families wishes after goals of care conversation   T2DM -Basal insulin 30 units, used 10  units of SSI -Hold metformin still   HTN -Continue Toprol XL 100 mg, hydralazine 10mg , and amlodipine 10mg     Hypothyroidism -Levothyroxine 68.   Prior to Admission Living Arrangement:home  Anticipated Discharge Location: home Barriers to Discharge:Medical treatment  Dispo: Anticipated discharge in approximately 1-2 day(s).   Amber Rogue, DO 01/26/2023, 2:12 PM Pager:[phone] After 5pm on weekdays and 1pm on weekends: On Call pager [phone]

## 2023-01-26 NOTE — Progress Notes (Signed)
Patient ID: Amber Dudley, female   DOB: 1931/01/21, 89 y.o.   MRN: [id number] I spoke with patient's son.  Discussed that she has a infection of the right heel and right leg.  Recommended proceeding with a above-the-knee amputation on the right that this would be the safest treatment course with patient being discharged to skilled nursing.  Discussed that the other option would be to discharge with oral antibiotics and if patient became septic she would be admitted at that time placed on IV antibiotics and proceed with urgent amputation.  Discussed the longer we wait the higher risk of a systemic infection.  Discussed that patient is at increased risk of complications with operative or nonoperative treatment.

## 2023-01-26 NOTE — Plan of Care (Signed)
  Problem: Education: Goal: Ability to describe self-care measures that may prevent or decrease complications (Diabetes Survival Skills Education) will improve Outcome: Progressing   Problem: Coping: Goal: Ability to adjust to condition or change in health will improve Outcome: Progressing   Problem: Fluid Volume: Goal: Ability to maintain a balanced intake and output will improve Outcome: Progressing   Problem: Health Behavior/Discharge Planning: Goal: Ability to identify and utilize available resources and services will improve Outcome: Progressing Goal: Ability to manage health-related needs will improve Outcome: Progressing   Problem: Metabolic: Goal: Ability to maintain appropriate glucose levels will improve Outcome: Progressing   Problem: Nutritional: Goal: Maintenance of adequate nutrition will improve Outcome: Progressing Goal: Progress toward achieving an optimal weight will improve Outcome: Progressing   Problem: Skin Integrity: Goal: Risk for impaired skin integrity will decrease Outcome: Progressing   Problem: Tissue Perfusion: Goal: Adequacy of tissue perfusion will improve Outcome: Progressing   Problem: Education: Goal: Knowledge of General Education information will improve Description: Including pain rating scale, medication(s)/side effects and non-pharmacologic comfort measures Outcome: Progressing   Problem: Health Behavior/Discharge Planning: Goal: Ability to manage health-related needs will improve Outcome: Progressing   Problem: Clinical Measurements: Goal: Ability to maintain clinical measurements within normal limits will improve Outcome: Progressing Goal: Will remain free from infection Outcome: Progressing Goal: Diagnostic test results will improve Outcome: Progressing Goal: Cardiovascular complication will be avoided Outcome: Progressing   Problem: Activity: Goal: Risk for activity intolerance will decrease Outcome: Progressing    Problem: Nutrition: Goal: Adequate nutrition will be maintained Outcome: Progressing   Problem: Coping: Goal: Level of anxiety will decrease Outcome: Progressing   Problem: Elimination: Goal: Will not experience complications related to bowel motility Outcome: Progressing Goal: Will not experience complications related to urinary retention Outcome: Progressing   Problem: Pain Managment: Goal: General experience of comfort will improve Outcome: Progressing   Problem: Safety: Goal: Ability to remain free from injury will improve Outcome: Progressing   Problem: Skin Integrity: Goal: Risk for impaired skin integrity will decrease Outcome: Progressing   Problem: Clinical Measurements: Goal: Respiratory complications will improve Outcome: Completed/Met

## 2023-01-26 NOTE — Progress Notes (Signed)
Patient ID: Amber Dudley, female   DOB: 1931/02/11, 89 y.o.   MRN: [id number] Discussed with the patient when her son arrives they can call me and we can discuss surgical treatment options.  My cell phone number is [phone].

## 2023-01-26 NOTE — Progress Notes (Signed)
   01/26/23 1141  Mobility  Activity Ambulated with assistance in hallway  Level of Assistance Contact guard assist, steadying assist  Assistive Device Front wheel walker  Distance Ambulated (ft) 75 ft  Activity Response Tolerated well  Mobility Referral Yes  $Mobility charge 1 Mobility  Mobility Specialist Start Time (ACUTE ONLY) 1125  Mobility Specialist Stop Time (ACUTE ONLY) 1140  Mobility Specialist Time Calculation (min) (ACUTE ONLY) 15 min    Mobility Specialist: Progress Note  Pt received in bed, agreeable to mobility session. MinG throughout session. C/o being winded, cues for PLB. After session, pt returned to chair with all needs met. Call bell within reach. Chair alarm on.   Barnie Mort Mobility Specialist Please contact via SecureChat or Rehab office at [phone]

## 2023-01-26 NOTE — Plan of Care (Signed)
  Problem: Coping: Goal: Ability to adjust to condition or change in health will improve Outcome: Progressing   Problem: Fluid Volume: Goal: Ability to maintain a balanced intake and output will improve Outcome: Progressing   Problem: Education: Goal: Knowledge of General Education information will improve Description: Including pain rating scale, medication(s)/side effects and non-pharmacologic comfort measures Outcome: Progressing   Problem: Health Behavior/Discharge Planning: Goal: Ability to manage health-related needs will improve Outcome: Progressing   Problem: Clinical Measurements: Goal: Ability to maintain clinical measurements within normal limits will improve Outcome: Progressing

## 2023-01-26 NOTE — Progress Notes (Signed)
Physical Therapy Treatment Patient Details Name: Amber Dudley MRN: [id number] DOB: [birth year] Today's Date: 01/26/2023   History of Present Illness 89 year old woman presents to ED 7/18 after hitting R foot causing increased bleeding 2 days prior and onset of increased pain in weight bearing on R foot. Reports no lack of sensation or neuropathy but is unaware of needle embedded in R 5th metatarsal. Found to have chronic diabetic R foot ulcer. Xrays reveal soft tissue ulceration overlying plantar surface of calcaneus and subcutaneous gas but no focal bony erosions. Admitted for observation and development of plan of care. PMH:HTN, Insulin dependent T2DM, Hypothyroidism, and chronic lower extremity edema    PT Comments  Pt seated in recliner on arrival.  She denies pain.  Tx limited due to increased WOB and need for rest period.  Pt would benefit from rollator if she chooses to not proceed with surgery for energy conservation.  Pt did have one episode of incontinence this session that she was unaware of.  Will continue to follow during acute hospitalization.         Assistance Recommended at Discharge Intermittent Supervision/Assistance  If plan is discharge home, recommend the following:  Can travel by private vehicle    A little help with walking and/or transfers;A little help with bathing/dressing/bathroom;Assistance with cooking/housework;Assist for transportation;Help with stairs or ramp for entrance      Equipment Recommendations  None recommended by PT    Recommendations for Other Services OT consult     Precautions / Restrictions Precautions Precautions: Fall Precaution Comments: fall 2 year prior Restrictions Weight Bearing Restrictions: No     Mobility  Bed Mobility               General bed mobility comments: Pt seated in recliner on arrival.    Transfers Overall transfer level: Needs assistance Equipment used: Rolling walker (2 wheels) Transfers: Sit to/from  Stand Sit to Stand: Min assist           General transfer comment: Cues for hand placement to and from seated surface this session.  Pt required cues to back entirely to seated surface before sitting as she attempts to leave RW out in front.    Ambulation/Gait Ambulation/Gait assistance: Min guard Gait Distance (Feet): 140 Feet Assistive device: Rolling walker (2 wheels) Gait Pattern/deviations: Knee flexed in stance - right, Knee flexed in stance - left, Decreased step length - right, Decreased step length - left, Shuffle, Trunk flexed, Narrow base of support, Decreased weight shift to right Gait velocity: slowed     General Gait Details: Pt with flexed posture, she would benefit from youth height RW to improve posture and reduce stress on shoulders. Pt presents with [facility] this session, required several standing breaks due to increased WOB.   Stairs             Wheelchair Mobility     Tilt Bed    Modified Rankin (Stroke Patients Only)       Balance Overall balance assessment: Needs assistance Sitting-balance support: Feet supported, No upper extremity supported Sitting balance-Leahy Scale: Fair       Standing balance-Leahy Scale: Poor                              Cognition Arousal/Alertness: Awake/alert Behavior During Therapy: WFL for tasks assessed/performed Overall Cognitive Status: Impaired/Different from baseline Area of Impairment: Safety/judgement  Safety/Judgement: Decreased awareness of safety     General Comments: Decreased knowledge of RW safety with use        Exercises      General Comments        Pertinent Vitals/Pain Pain Assessment Pain Assessment: Faces Faces Pain Scale: Hurts little more Pain Location: R foot Pain Descriptors / Indicators: Discomfort, Guarding Pain Intervention(s): Monitored during session, Repositioned    Home Living                          Prior  Function            PT Goals (current goals can now be found in the care plan section) Acute Rehab PT Goals Potential to Achieve Goals: Fair Progress towards PT goals: Progressing toward goals    Frequency    Min 1X/week      PT Plan Frequency needs to be updated    Co-evaluation              AM-PAC PT "6 Clicks" Mobility   Outcome Measure  Help needed turning from your back to your side while in a flat bed without using bedrails?: None Help needed moving from lying on your back to sitting on the side of a flat bed without using bedrails?: A Little Help needed moving to and from a bed to a chair (including a wheelchair)?: A Little Help needed standing up from a chair using your arms (e.g., wheelchair or bedside chair)?: A Little Help needed to walk in hospital room?: A Little Help needed climbing 3-5 steps with a railing? : A Lot 6 Click Score: 18    End of Session Equipment Utilized During Treatment: Gait belt Activity Tolerance: Patient tolerated treatment well Patient left: in chair;with call bell/phone within reach;with nursing/sitter in room Nurse Communication: Mobility status (NT) PT Visit Diagnosis: Unsteadiness on feet (R26.81);Other abnormalities of gait and mobility (R26.89);Muscle weakness (generalized) (M62.81);Difficulty in walking, not elsewhere classified (R26.2);Pain Pain - Right/Left: Right Pain - part of body: Ankle and joints of foot     Time: 1549-1600 PT Time Calculation (min) (ACUTE ONLY): 11 min  Charges:    $Gait Training: 8-22 mins PT General Charges $$ ACUTE PT VISIT: 1 Visit                     Bonney Leitz , PTA Acute Rehabilitation Services Office [phone]    Florestine Avers 01/26/2023, 4:19 PM

## 2023-01-27 DIAGNOSIS — E1152 Type 2 diabetes mellitus with diabetic peripheral angiopathy with gangrene: Secondary | ICD-10-CM | POA: Diagnosis not present

## 2023-01-27 DIAGNOSIS — Z794 Long term (current) use of insulin: Secondary | ICD-10-CM | POA: Diagnosis not present

## 2023-01-27 DIAGNOSIS — Z7189 Other specified counseling: Secondary | ICD-10-CM | POA: Diagnosis not present

## 2023-01-27 DIAGNOSIS — I10 Essential (primary) hypertension: Secondary | ICD-10-CM | POA: Diagnosis not present

## 2023-01-27 LAB — RENAL FUNCTION PANEL: BUN: 25 mg/dL — ABNORMAL HIGH (ref 8–23)

## 2023-01-27 LAB — GLUCOSE, CAPILLARY
Glucose-Capillary: 65 mg/dL — ABNORMAL LOW (ref 70–99)
Glucose-Capillary: 86 mg/dL (ref 70–99)
Glucose-Capillary: 124 mg/dL — ABNORMAL HIGH (ref 70–99)
Glucose-Capillary: 157 mg/dL — ABNORMAL HIGH (ref 70–99)
Glucose-Capillary: 206 mg/dL — ABNORMAL HIGH (ref 70–99)

## 2023-01-27 MED ORDER — DICLOFENAC SODIUM 1 % EX GEL: 4.0000 g | Freq: Four times a day (QID) | CUTANEOUS | Status: DC | PRN

## 2023-01-27 MED ORDER — HYDRALAZINE HCL 10 MG PO TABS
10.0000 mg | ORAL_TABLET | Freq: Once | ORAL | Status: AC
  Administered 2023-01-27: 10 mg via ORAL
  Filled 2023-01-27: qty 1

## 2023-01-27 NOTE — Progress Notes (Signed)
   01/27/23 1500  Spiritual Encounters  Type of Visit Initial  Care provided to: Pt and family  Conversation partners present during encounter Nurse  Referral source Patient request  Reason for visit Advance directives  OnCall Visit No   Ch followed up with pt and helped family completing AD. No follow-up needed at this time.

## 2023-01-27 NOTE — [place] FL2 (Signed)
[place] MEDICAID FL2 LEVEL OF CARE FORM     IDENTIFICATION  Patient Name: Amber Dudley Birthdate: 1931-02-23 Sex: female Admission Date (Current Location): 01/19/2023  [facility] and [place] Number:  Producer, television/film/video and Address:  [facility] [place], [address], [place], [place] [address]      Provider Number: [id number]  Attending Physician Name and Address:  Reymundo Poll, MD  Relative Name and Phone Number:  Johnney Killian Lucila [place])  [phone] [facility])    Current Level of Care: Hospital Recommended Level of Care: Skilled Nursing Facility Prior Approval Number:    Date Approved/Denied:   PASRR Number: [id number]  Discharge Plan: SNF    Current Diagnoses: Patient Active Problem List   Diagnosis Date Noted   Goals of care, counseling/discussion 01/23/2023   Hypothyroidism 01/20/2023   Uncontrolled type 2 diabetes mellitus with hyperglycemia (HCC) 01/20/2023   Diabetic infection of right foot (HCC) 01/20/2023   Severe protein-calorie malnutrition (HCC) 01/20/2023   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   Uncontrolled type 2 diabetes mellitus without complication, with long-term current use of insulin 07/01/2015   Essential hypertension, benign 07/01/2015   Hyperlipidemia 07/01/2015    Orientation RESPIRATION BLADDER Height & Weight     Self, Time, Situation, Place  Normal Continent Weight: 182 lb 1.6 oz (82.6 kg) Height:  5\' 3"  (160 cm)  BEHAVIORAL SYMPTOMS/MOOD NEUROLOGICAL BOWEL NUTRITION STATUS      Continent Diet (see d/c summary)  AMBULATORY STATUS COMMUNICATION OF NEEDS Skin   Extensive Assist Verbally Other (Comment) (wound heel, right)                       Personal Care Assistance Level of Assistance  Bathing, Feeding, Dressing Bathing Assistance: Limited assistance Feeding assistance: Independent Dressing Assistance: Limited assistance     Functional Limitations Info  Sight, Hearing, Speech Sight  Info: Impaired Hearing Info: Adequate Speech Info: Adequate    SPECIAL CARE FACTORS FREQUENCY  OT (By licensed OT), PT (By licensed PT)     PT Frequency: 5x/week OT Frequency: 5x/week            Contractures Contractures Info: Not present    Additional Factors Info  Code Status, Allergies Code Status Info: DNR Allergies Info: no known allergies           Current Medications (01/27/2023):  This is the current hospital active medication list Current Facility-Administered Medications  Medication Dose Route Frequency Provider Last Rate Last Admin   acetaminophen (TYLENOL) tablet 1,000 mg  1,000 mg Oral Q8H Gomez-Caraballo, Maria, MD   1,000 mg at 01/27/23 1336   albuterol (PROVENTIL) (2.5 MG/3ML) 0.083% nebulizer solution 2.5 mg  2.5 mg Nebulization Q2H PRN Morene Crocker, MD       amLODipine (NORVASC) tablet 10 mg  10 mg Oral Daily Morene Crocker, MD   10 mg at 01/27/23 3295   cyanocobalamin (VITAMIN B12) tablet 500 mcg  500 mcg Oral Daily Morene Crocker, MD   500 mcg at 01/27/23 0850   diclofenac Sodium (VOLTAREN) 1 % topical gel 4 g  4 g Topical QID Reome, Earle J, RPH   4 g at 01/27/23 0851   enoxaparin (LOVENOX) injection 40 mg  40 mg Subcutaneous Q24H Bender, Emily, DO   40 mg at 01/26/23 1725   gabapentin (NEURONTIN) capsule 100 mg  100 mg Oral Daily Morene Crocker, MD   100 mg at 01/27/23 0850   Gerhardt's butt cream  Topical BID Ginnie Smart, MD   Given at 01/27/23 9528   hydrocerin (EUCERIN) cream   Topical Daily Ginnie Smart, MD   Given at 01/27/23 0851   insulin aspart (novoLOG) injection 0-15 Units  0-15 Units Subcutaneous TID WC Morene Crocker, MD   2 Units at 01/27/23 1142   insulin aspart (novoLOG) injection 0-5 Units  0-5 Units Subcutaneous QHS Morene Crocker, MD   3 Units at 01/25/23 2221   insulin glargine-yfgn [facility]) injection 30 Units  30 Units Subcutaneous Daily Faith Rogue, DO   30 Units at  01/27/23 0850   leptospermum manuka honey (MEDIHONEY) paste 1 Application  1 Application Topical Daily Reymundo Poll, MD   1 Application at 01/27/23 0851   levothyroxine (SYNTHROID) tablet 68.5 mcg  68.5 mcg Oral Q0600 Faith Rogue, DO   68.5 mcg at 01/27/23 0636   lidocaine (LIDODERM) 5 % 1 patch  1 patch Transdermal Q24H Morene Crocker, MD   1 patch at 01/26/23 1527   metoprolol succinate (TOPROL-XL) 24 hr tablet 100 mg  100 mg Oral Daily Bender, Irving Burton, DO   100 mg at 01/27/23 0850   multivitamin with minerals tablet 1 tablet  1 tablet Oral Daily Ginnie Smart, MD   1 tablet at 01/27/23 0850   potassium chloride SA (KLOR-CON M) CR tablet 20 mEq  20 mEq Oral BID Faith Rogue, DO   20 mEq at 01/27/23 0850   senna-docusate (Senokot-S) tablet 1 tablet  1 tablet Oral QHS PRN Morene Crocker, MD   1 tablet at 01/26/23 2151     Discharge Medications: Please see discharge summary for a list of discharge medications.  Relevant Imaging Results:  Relevant Lab Results:   Additional Information SSN [id number] [place], [place]

## 2023-01-27 NOTE — Plan of Care (Signed)
  Problem: Fluid Volume: Goal: Ability to maintain a balanced intake and output will improve Outcome: Progressing   Problem: Education: Goal: Ability to describe self-care measures that may prevent or decrease complications (Diabetes Survival Skills Education) will improve Outcome: Progressing Goal: Individualized Educational Video(s) Outcome: Progressing   Problem: Coping: Goal: Ability to adjust to condition or change in health will improve Outcome: Progressing   Problem: Health Behavior/Discharge Planning: Goal: Ability to identify and utilize available resources and services will improve Outcome: Progressing Goal: Ability to manage health-related needs will improve Outcome: Progressing

## 2023-01-27 NOTE — Progress Notes (Signed)
   01/27/23 1500  Spiritual Encounters  Type of Visit Initial  Care provided to: Pt and family  Conversation partners present during encounter Nurse  Referral source Patient request  Reason for visit Advance directives  OnCall Visit No   Ch responded to request for AD. Family was present at bedside. Ch assisted pt with AD education. Ch is trying to gather the necessary personnel to notarize the form. Ch will follow-up.

## 2023-01-27 NOTE — Progress Notes (Signed)
Physical Therapy Treatment Patient Details Name: Amber Dudley MRN: [id number] DOB: 01/13/1931 Today's Date: 01/27/2023   History of Present Illness 89 year old woman presents to ED 7/18 after hitting R foot causing increased bleeding 2 days prior and onset of increased pain in weight bearing on R foot. Reports no lack of sensation or neuropathy but is unaware of needle embedded in R 5th metatarsal. Found to have chronic diabetic R foot ulcer. Xrays reveal soft tissue ulceration overlying plantar surface of calcaneus and subcutaneous gas but no focal bony erosions. Admitted for observation and development of plan of care. PMH:HTN, Insulin dependent T2DM, Hypothyroidism, and chronic lower extremity edema    PT Comments  Pt seated in recliner on arrival. She is feeling sleepy but motivated to move this afternoon.  Pt performed increased gt distance with less SHOB.  She continues to require cues for safety during transitions.       Assistance Recommended at Discharge Intermittent Supervision/Assistance  If plan is discharge home, recommend the following:  Can travel by private vehicle    A little help with walking and/or transfers;A little help with bathing/dressing/bathroom;Assistance with cooking/housework;Assist for transportation;Help with stairs or ramp for entrance   Yes  Equipment Recommendations  None recommended by PT    Recommendations for Other Services       Precautions / Restrictions Precautions Precautions: Fall Precaution Comments: fall 2 year prior Restrictions Weight Bearing Restrictions: No     Mobility  Bed Mobility Overal bed mobility: Needs Assistance Bed Mobility: Sit to Supine       Sit to supine: Supervision   General bed mobility comments: Pt able to return back to bed without assistance.    Transfers Overall transfer level: Needs assistance Equipment used: Rolling walker (2 wheels) Transfers: Sit to/from Stand Sit to Stand: Supervision            General transfer comment: Cues for hand placement as she attempts to pull on RW to rise into standing.  Pt needed VCs for safety to back entirely to seated surface.    Ambulation/Gait Ambulation/Gait assistance: Min guard Gait Distance (Feet): 250 Feet Assistive device: Rolling walker (2 wheels) Gait Pattern/deviations: Knee flexed in stance - right, Knee flexed in stance - left, Decreased step length - right, Decreased step length - left, Shuffle, Trunk flexed, Narrow base of support, Decreased weight shift to right Gait velocity: slowed     General Gait Details: Pt with flexed posture, she would benefit from youth height RW to improve posture and reduce stress on shoulders. minimal increase in WOB this session and able to go for a longer distance.   Stairs             Wheelchair Mobility     Tilt Bed    Modified Rankin (Stroke Patients Only)       Balance Overall balance assessment: Needs assistance Sitting-balance support: Feet supported, No upper extremity supported Sitting balance-Leahy Scale: Fair Sitting balance - Comments: sitting EOB while performing sock management   Standing balance support: During functional activity, Reliant on assistive device for balance, Bilateral upper extremity supported Standing balance-Leahy Scale: Poor Standing balance comment: Reliant on RW to ambulate                            Cognition Arousal/Alertness: Awake/alert Behavior During Therapy: WFL for tasks assessed/performed Overall Cognitive Status: Within Functional Limits for tasks assessed  Exercises      General Comments        Pertinent Vitals/Pain Pain Assessment Pain Assessment: No/denies pain    Home Living                          Prior Function            PT Goals (current goals can now be found in the care plan section) Acute Rehab PT Goals Patient Stated Goal: to  be independent Potential to Achieve Goals: Fair Progress towards PT goals: Progressing toward goals    Frequency    Min 1X/week      PT Plan Frequency needs to be updated    Co-evaluation              AM-PAC PT "6 Clicks" Mobility   Outcome Measure  Help needed turning from your back to your side while in a flat bed without using bedrails?: None Help needed moving from lying on your back to sitting on the side of a flat bed without using bedrails?: A Little Help needed moving to and from a bed to a chair (including a wheelchair)?: A Little Help needed standing up from a chair using your arms (e.g., wheelchair or bedside chair)?: A Little Help needed to walk in hospital room?: A Little Help needed climbing 3-5 steps with a railing? : A Lot 6 Click Score: 18    End of Session Equipment Utilized During Treatment: Gait belt Activity Tolerance: Patient tolerated treatment well Patient left: in bed;with call bell/phone within reach;with bed alarm set Nurse Communication: Mobility status PT Visit Diagnosis: Unsteadiness on feet (R26.81);Other abnormalities of gait and mobility (R26.89);Muscle weakness (generalized) (M62.81);Difficulty in walking, not elsewhere classified (R26.2);Pain Pain - Right/Left: Right Pain - part of body: Ankle and joints of foot     Time: 4166-0630 PT Time Calculation (min) (ACUTE ONLY): 15 min  Charges:    $Gait Training: 8-22 mins PT General Charges $$ ACUTE PT VISIT: 1 Visit                     Bonney Leitz , PTA Acute Rehabilitation Services Office [phone]    Florestine Avers 01/27/2023, 3:28 PM

## 2023-01-27 NOTE — Inpatient Diabetes Management (Signed)
Inpatient Diabetes Program Recommendations  AACE/ADA: New Consensus Statement on Inpatient Glycemic Control (2015)  Target Ranges:  Prepandial:   less than 140 mg/dL      Peak postprandial:   less than 180 mg/dL (1-2 hours)      Critically ill patients:  140 - 180 mg/dL   Lab Results  Component Value Date   GLUCAP 86 01/27/2023   HGBA1C 8.1 (H) 01/19/2023    Latest Reference Range & Units 01/26/23 07:54 01/26/23 11:56 01/26/23 16:15 01/26/23 21:20 01/27/23 07:16 01/27/23 07:43  Glucose-Capillary 70 - 99 mg/dL 82 213 (H) 086 (H) 578 (H) 65 (L) 86  (H): Data is abnormally high (L): Data is abnormally low Review of Glycemic Control  Diabetes history: type 2 Outpatient Diabetes medications: Basaglar 15 units daily, 20 units every PM, Metformin 1000 mg BID Current orders for Inpatient glycemic control: Semglee 30 units daily, Novolog 0-15 units TID, HS scale  Inpatient Diabetes Program Recommendations:   Noted that patient's CBG this am was 65 mg/dl.   Recommend decreasing Semglee dose to 25 units daily if continue to have low blood sugars.   Smith Mince RN BSN CDE Diabetes Coordinator Pager: [phone]  8am-5pm

## 2023-01-27 NOTE — TOC Progression Note (Addendum)
Transition of Care [facility]) - Progression Note    Patient Details  Name: Amber Dudley MRN: [id number] Date of Birth: 09/10/1930  Transition of Care [facility]) CM/SW Contact  Erin Sons, [place] Phone Number: 01/27/2023, 3:39 PM  Clinical Narrative:      CSW met with pt and family with internal medicine team to discuss disposition. CSW explained medicare coverage of SNF. Plan is for pt to go to SNF for short term rehab with outpatient palliative services. If she declines, family would likely pursue an inpatient hospice facility, otherwise pt would return home after rehab. Pt agreeable to SNF referrals to [place] and [place]. Family does not want [place] facility. Fl2 completed and bed requests sent in hub.   Expected Discharge Plan: Skilled Nursing Facility Barriers to Discharge: SNF Pending bed offer  Expected Discharge Plan and Services     Living arrangements for the past 2 months: Single Family Home                           HH Arranged: RN, PT, OT, Patient Refused HH, Social Work [facility] Agency: [facility] Date [facility] Agency Contacted: 01/26/23 Time HH Agency Contacted: 1607 Representative spoke with at [facility] Agency: Kandee Keen   Social Determinants of Health (SDOH) Interventions SDOH Screenings   Tobacco Use: Low Risk  (01/19/2023)    Readmission Risk Interventions     No data to display

## 2023-01-27 NOTE — Care Management Important Message (Signed)
Important Message  Patient Details  Name: Amber Dudley MRN: [id number] Date of Birth: 04-28-1931   Medicare Important Message Given:  Yes     Sherilyn Banker 01/27/2023, 2:58 PM

## 2023-01-27 NOTE — Progress Notes (Signed)
Subjective:  Amber Dudley is a pleasant 89 year old woman with a past medical history of HTN, Insulin dependent T2DM, Hypothyroidism, and chronic lower extremity edema who is admitted for further evaluation and treatment of a right calcaneal subacute wet gangrenous diabetic ulcer. MRI showed Large plantar heel ulceration with internal gas and hypo-enhancement measuring about 3.0 by 4.5 by 4.2 cm without osteomyelitis. Will further discuss home with hospice vs AKA with son.   Today, she does not have any complaints. She is leaning towards going home. We spoke to her about her options once again and will circle back to speak with son.   Objective:  Vital signs in last 24 hours: Vitals:   01/26/23 1437 01/26/23 2034 01/27/23 0448 01/27/23 0719  BP: (!) 135/56 (!) 150/66 (!) 157/83 (!) 151/67  Pulse: 66 77 76 78  Resp: 18 16 16 16   Temp: 98.3 F (36.8 C) 97.8 F (36.6 C) 98.7 F (37.1 C) 98.5 F (36.9 C)  TempSrc:  Oral    SpO2: 98% 93% 95% 94%  Weight:      Height:      Physical Exam: General: NAD, alert, sitting on the edge of the bed  Cardiac:RRR, no murmur  Pulmonary:clear b/l, normal effort MSK:B/l edema and discoloration unchanged from previous exam, malodorous R calcaneal ulcer covered Skin: warm and dry [place]: alert and oriented     Latest Ref Rng & Units 01/27/2023    2:51 AM 01/26/2023    7:37 AM 01/25/2023    5:57 AM  CBC  WBC 4.0 - 10.5 K/uL 7.1  6.0  5.6   Hemoglobin 12.0 - 15.0 g/dL 9.1  9.6  9.7   Hematocrit 36.0 - 46.0 % 29.2  29.8  30.5   Platelets 150 - 400 K/uL 410  450  459        Latest Ref Rng & Units 01/27/2023    2:51 AM 01/26/2023    7:37 AM 01/25/2023    5:57 AM  CMP  Glucose 70 - 99 mg/dL 96  82  66   BUN 8 - 23 mg/dL 25  16  16    Creatinine 0.44 - 1.00 mg/dL 9.62  9.52  8.41   Sodium 135 - 145 mmol/L 137  140  140   Potassium 3.5 - 5.1 mmol/L 4.3  4.3  3.8   Chloride 98 - 111 mmol/L 104  105  105   CO2 22 - 32 mmol/L 26  24  26    Calcium  8.9 - 10.3 mg/dL 8.5  8.7  8.4     Assessment/Plan:  Principal Problem:   Diabetic wet gangrene of the foot (HCC) Active Problems:   Hypothyroidism   Uncontrolled type 2 diabetes mellitus with hyperglycemia (HCC)   Diabetic infection of right foot (HCC)   Severe protein-calorie malnutrition (HCC)   Goals of care, counseling/discussion  Right diabetic gangrenous ulcer right heel Goals of Care  MRI showed large plantar heel ulceration with internal gas and hypo enhancement measuring about 3.0 by 4.5 by 4.2 cm without osteomyelitis.  -Family meeting yesterday with palliative care: home with home health  -Will further speak with family today, patient either needs home with hospice or AKA -No leukocytosis today, patient finished antibiotic therapy on 7/24 -Continue PT    T2DM -Continue using basal insulin 30 units with SSI -Continue to hold metformin while hospitalized  AKI vs CKD Serum creatine appears to be elevated during her hospitalization with a GFR of 40s-50s. I  believe she has CKD rather than AKI due to chronically elevated serum Creatinine while we are holding nephrotoxic agents and she has good oral intake. -Possibly due to chronic HTN   HTN Continue:Toprol XL 100 mg, hydralazine 10mg , and amlodipine 10mg     Hypothyroidism -Levothyroxine 68.   Prior to Admission Living Arrangement:home Anticipated Discharge Location:home Barriers to Discharge:medical management  Dispo: Anticipated discharge in approximately 1-2 day(s).   Faith Rogue, DO 01/27/2023, 9:16 AM Pager: [phone] After 5pm on weekdays and 1pm on weekends: On Call pager [phone]

## 2023-01-28 DIAGNOSIS — E1152 Type 2 diabetes mellitus with diabetic peripheral angiopathy with gangrene: Secondary | ICD-10-CM | POA: Diagnosis not present

## 2023-01-28 DIAGNOSIS — Z794 Long term (current) use of insulin: Secondary | ICD-10-CM | POA: Diagnosis not present

## 2023-01-28 DIAGNOSIS — Z515 Encounter for palliative care: Secondary | ICD-10-CM | POA: Diagnosis not present

## 2023-01-28 LAB — GLUCOSE, CAPILLARY
Glucose-Capillary: 174 mg/dL — ABNORMAL HIGH (ref 70–99)
Glucose-Capillary: 163 mg/dL — ABNORMAL HIGH (ref 70–99)
Glucose-Capillary: 107 mg/dL — ABNORMAL HIGH (ref 70–99)
Glucose-Capillary: 111 mg/dL — ABNORMAL HIGH (ref 70–99)

## 2023-01-28 MED ORDER — HYDRALAZINE HCL 10 MG PO TABS
10.0000 mg | ORAL_TABLET | Freq: Every day | ORAL | Status: DC
  Administered 2023-01-28 – 2023-01-31 (×4): 10 mg via ORAL
  Filled 2023-01-28 (×4): qty 1

## 2023-01-28 NOTE — Progress Notes (Signed)
Subjective:  Amber Dudley is a pleasant 89 year old woman with a past medical history of HTN, Insulin dependent T2DM, Hypothyroidism, and chronic lower extremity edema who is admitted for further evaluation and treatment of a right calcaneal subacute wet gangrenous diabetic ulcer. MRI showed Large plantar heel ulceration with internal gas and hypo-enhancement measuring about 3.0 by 4.5 by 4.2 cm without osteomyelitis. She has decided on SNF placement with hospice in the future, her code status has changed to DNR/DNI. Her family is supportive of her decision.   Today, she does not have any complaints. She denies pain in her legs.  Objective:  Vital signs in last 24 hours: Vitals:   01/27/23 1500 01/27/23 2045 01/28/23 0600 01/28/23 0914  BP: (!) 150/68 114/89 117/75 (!) 149/63  Pulse: 76   78  Resp: 16 17 18 20   Temp: 98.3 F (36.8 C) 98.2 F (36.8 C) 98.3 F (36.8 C) 97.6 F (36.4 C)  TempSrc:  Oral Oral   SpO2: 95% 97% 98% 97%  Weight:      Height:       Physical Exam: General:NAD, alert, pleasant  Cardiac:RRR, no murmurs auscultated  Pulmonary:clear to auscultate b/l, normal effort  MSK:b/l edema improved after compression with SCD, discoloration unchanged, right calcaneal ulcer is wrapped with dressing, malodorous  Skin:warm and dry Psych: Pleasant mood, normal affect     Latest Ref Rng & Units 01/28/2023    2:47 AM 01/27/2023    2:51 AM 01/26/2023    7:37 AM  CBC  WBC 4.0 - 10.5 K/uL 6.6  7.1  6.0   Hemoglobin 12.0 - 15.0 g/dL 9.8  9.1  9.6   Hematocrit 36.0 - 46.0 % 31.0  29.2  29.8   Platelets 150 - 400 K/uL 429  410  450        Latest Ref Rng & Units 01/28/2023    2:47 AM 01/27/2023    2:51 AM 01/26/2023    7:37 AM  CMP  Glucose 70 - 99 mg/dL 664  96  82   BUN 8 - 23 mg/dL 25  25  16    Creatinine 0.44 - 1.00 mg/dL 4.03  4.74  2.59   Sodium 135 - 145 mmol/L 136  137  140   Potassium 3.5 - 5.1 mmol/L 4.7  4.3  4.3   Chloride 98 - 111 mmol/L 104  104  105    CO2 22 - 32 mmol/L 25  26  24    Calcium 8.9 - 10.3 mg/dL 8.6  8.5  8.7     Assessment/Plan:  Principal Problem:   Diabetic wet gangrene of the foot (HCC) Active Problems:   Hypothyroidism   Uncontrolled type 2 diabetes mellitus with hyperglycemia (HCC)   Diabetic infection of right foot (HCC)   Severe protein-calorie malnutrition (HCC)   Goals of care, counseling/discussion  Right diabetic gangrenous ulcer right heel Goals of Care  MRI showed large plantar heel ulceration with internal gas and hypo enhancement measuring about 3.0 by 4.5 by 4.2 cm without osteomyelitis. After extensive conversation with family and patient, she decided against an AKA. -No leukocytosis today, patient finished 7 day course of zosyn and daptomycin antibiotic therapy on 7/24 -Lab holiday tomorrow 7/28 -Continue PT -SNF placement pending -Code status updated to DNR/DNI, chaplain consulted for advanced directive   T2DM -Continue using basal insulin 30 units with SSI -Continue to hold metformin while hospitalized     HTN: Toprol XL 100mg , hydralazine 10mg , amlodipine 10mg   AKI vs CKD Serum creatine appears to be elevated during her hospitalization with a GFR of 40s-50s. I believe she has CKD rather than AKI due to chronically elevated serum Creatinine while we are holding nephrotoxic agents and she has good oral intake. -Likely multifactorial due to T2DM and HTN  -Serum Cr 1.05 this morning -Lab holiday tomorrow   Hypothyroidism -Levothyroxine 68.   Prior to Admission Living Arrangement:Home Anticipated Discharge Location:SNF Barriers to Discharge:SNF placement Dispo: Anticipated discharge in approximately 1-2 day(s).   Faith Rogue, DO 01/28/2023, 11:54 AM Pager: [phone] After 5pm on weekdays and 1pm on weekends: On Call pager [phone]

## 2023-01-28 NOTE — Plan of Care (Signed)
  Problem: Coping: Goal: Ability to adjust to condition or change in health will improve Outcome: Progressing   Problem: Fluid Volume: Goal: Ability to maintain a balanced intake and output will improve Outcome: Progressing   

## 2023-01-28 NOTE — Progress Notes (Signed)
RN contacted team and asked [place] to call the son to answer questions about antibiotics. I spoke with ID and they would recommend Augmentin and doxycycline for four weeks but believe that the harm outweighs the risks. I spoke with the son on the phone and explained the risks of antibiotics such as nausea, diarrhea, and C. difficile due to recent antibiotic therapy while in hospital. He is aware that there is limited benefit for her to take oral antibiotics. They would like one of our team members to check the wound tomorrow and see "if it got better or not" and then they will decide if they would like to pursue 4 weeks of oral antibiotics. I offered my recommendations: against antibiotic therapy due to the harm outweighing the risks.  I will relay the message to the IMTS team.

## 2023-01-29 LAB — GLUCOSE, CAPILLARY
Glucose-Capillary: 127 mg/dL — ABNORMAL HIGH (ref 70–99)
Glucose-Capillary: 69 mg/dL — ABNORMAL LOW (ref 70–99)
Glucose-Capillary: 299 mg/dL — ABNORMAL HIGH (ref 70–99)
Glucose-Capillary: 151 mg/dL — ABNORMAL HIGH (ref 70–99)
Glucose-Capillary: 185 mg/dL — ABNORMAL HIGH (ref 70–99)

## 2023-01-29 MED ORDER — CHLORHEXIDINE GLUCONATE CLOTH 2 % EX PADS: 6.0000 | MEDICATED_PAD | Freq: Every day | CUTANEOUS | Status: DC

## 2023-01-29 NOTE — TOC Progression Note (Signed)
Transition of Care [facility]) - Progression Note    Patient Details  Name: Amber Dudley MRN: [id number] Date of Birth: 1930-10-19  Transition of Care [facility]) CM/SW Contact  Jimmy Picket, [place] Phone Number: 01/29/2023, 3:26 PM  Clinical Narrative:     CSW gave bed offers to pt and family. Pt reports she really wants a private room. They would like to see if they get any other bed offers on Monday before making decision.   Expected Discharge Plan: Skilled Nursing Facility Barriers to Discharge: SNF Pending bed offer  Expected Discharge Plan and Services       Living arrangements for the past 2 months: Single Family Home                           HH Arranged: RN, PT, OT, Patient Refused HH, Social Work [facility] Agency: [facility] Date [facility] Agency Contacted: 01/26/23 Time HH Agency Contacted: 1607 Representative spoke with at [facility] Agency: Kandee Keen   Social Determinants of Health (SDOH) Interventions SDOH Screenings   Tobacco Use: Low Risk  (01/19/2023)    Readmission Risk Interventions     No data to display

## 2023-01-29 NOTE — Plan of Care (Signed)
  Problem: Education: Goal: Ability to describe self-care measures that may prevent or decrease complications (Diabetes Survival Skills Education) will improve Outcome: Progressing Goal: Individualized Educational Video(s) Outcome: Progressing   Problem: Coping: Goal: Ability to adjust to condition or change in health will improve Outcome: Progressing   Problem: Fluid Volume: Goal: Ability to maintain a balanced intake and output will improve Outcome: Progressing   Problem: Health Behavior/Discharge Planning: Goal: Ability to identify and utilize available resources and services will improve Outcome: Progressing Goal: Ability to manage health-related needs will improve Outcome: Progressing   Problem: Metabolic: Goal: Ability to maintain appropriate glucose levels will improve Outcome: Progressing   Problem: Nutritional: Goal: Maintenance of adequate nutrition will improve Outcome: Progressing Goal: Progress toward achieving an optimal weight will improve Outcome: Progressing   Problem: Skin Integrity: Goal: Risk for impaired skin integrity will decrease Outcome: Progressing   Problem: Tissue Perfusion: Goal: Adequacy of tissue perfusion will improve Outcome: Progressing   Problem: Education: Goal: Knowledge of General Education information will improve Description: Including pain rating scale, medication(s)/side effects and non-pharmacologic comfort measures Outcome: Progressing   Problem: Health Behavior/Discharge Planning: Goal: Ability to manage health-related needs will improve Outcome: Progressing   Problem: Clinical Measurements: Goal: Ability to maintain clinical measurements within normal limits will improve Outcome: Progressing Goal: Will remain free from infection Outcome: Progressing Goal: Diagnostic test results will improve Outcome: Progressing Goal: Cardiovascular complication will be avoided Outcome: Progressing   Problem: Activity: Goal: Risk  for activity intolerance will decrease Outcome: Progressing   Problem: Nutrition: Goal: Adequate nutrition will be maintained Outcome: Progressing   Problem: Coping: Goal: Level of anxiety will decrease Outcome: Progressing   Problem: Elimination: Goal: Will not experience complications related to bowel motility Outcome: Progressing Goal: Will not experience complications related to urinary retention Outcome: Progressing   Problem: Pain Managment: Goal: General experience of comfort will improve Outcome: Progressing   Problem: Safety: Goal: Ability to remain free from injury will improve Outcome: Progressing   Problem: Skin Integrity: Goal: Risk for impaired skin integrity will decrease Outcome: Progressing

## 2023-01-29 NOTE — Progress Notes (Signed)
Subjective:  Amber Dudley is a pleasant 89 year old woman with a past medical history of HTN, Insulin dependent T2DM, Hypothyroidism, and chronic lower extremity edema who is admitted for further evaluation and treatment of a right calcaneal subacute wet gangrenous diabetic ulcer. MRI showed Large plantar heel ulceration with internal gas and hypo-enhancement measuring about 3.0 by 4.5 by 4.2 cm without osteomyelitis. She has decided on SNF placement with hospice in the future, her code status has changed to DNR/DNI. Her family is supportive of her decision.   Today, she does not have any complaints. She denies pain in her legs. Continues to endorse good appetite. No chest pain, shortness of breath, rigors, or acute changes to her baseline since arriving in hospital.  Her son [name], and grandson were at beside today and they inquired about maintenance antibiotic therapy since she will not undergo amputation or debridement. Both family members understand that this attempt may be futile given patient's peripheral vascular disease and antibody penetration but they would like to try any alternative strategies that may slow down the patient's infection from getting into bloodstream. Patient is concerned about potential side effects, including diarrhea and abdominal discomfort as it would require more clean up and people involved. Patient and family members will discuss and let team know whether they would like to proceed with this alternative.  Objective:  Vital signs in last 24 hours: Vitals:   01/28/23 0600 01/28/23 0914 01/28/23 1615 01/28/23 2007  BP: 117/75 (!) 149/63 127/64 136/63  Pulse:  78 64 73  Resp: 18 20 18 16   Temp: 98.3 F (36.8 C) 97.6 F (36.4 C) 97.6 F (36.4 C) 98 F (36.7 C)  TempSrc: Oral  Oral Oral  SpO2: 98% 97% 97% 97%  Weight:      Height:       Physical Exam: General:NAD, alert, pleasant  Cardiac:RRR, no murmurs auscultated  Pulmonary:clear to auscultate b/l,  normal effort  MSK:b/l edema improved after compression with SCD, discoloration unchanged right calcaneal ulcer is wrapped with dressing, malodorous  Skin:warm and dry Psych: Pleasant mood, normal affect     Latest Ref Rng & Units 01/28/2023    2:47 AM 01/27/2023    2:51 AM 01/26/2023    7:37 AM  CBC  WBC 4.0 - 10.5 K/uL 6.6  7.1  6.0   Hemoglobin 12.0 - 15.0 g/dL 9.8  9.1  9.6   Hematocrit 36.0 - 46.0 % 31.0  29.2  29.8   Platelets 150 - 400 K/uL 429  410  450        Latest Ref Rng & Units 01/28/2023    2:47 AM 01/27/2023    2:51 AM 01/26/2023    7:37 AM  CMP  Glucose 70 - 99 mg/dL 161  96  82   BUN 8 - 23 mg/dL 25  25  16    Creatinine 0.44 - 1.00 mg/dL 0.96  0.45  4.09   Sodium 135 - 145 mmol/L 136  137  140   Potassium 3.5 - 5.1 mmol/L 4.7  4.3  4.3   Chloride 98 - 111 mmol/L 104  104  105   CO2 22 - 32 mmol/L 25  26  24    Calcium 8.9 - 10.3 mg/dL 8.6  8.5  8.7     Assessment/Plan:  Principal Problem:   Diabetic wet gangrene of the foot (HCC) Active Problems:   Hypothyroidism   Uncontrolled type 2 diabetes mellitus with hyperglycemia (HCC)   Diabetic infection  of right foot (HCC)   Severe protein-calorie malnutrition (HCC)   Goals of care, counseling/discussion  Right diabetic gangrenous ulcer right heel Goals of Care  MRI showed large plantar heel ulceration with internal gas and hypo enhancement measuring about 3.0 by 4.5 by 4.2 cm without osteomyelitis. Patient decided against AKA after multiple discussions with IMTS, palliative care, orthopedic surgery, and social work. She is now s/p 7 day course of zosyn and daptomycin antibiotic therapy on 7/24. No labs today but in room patient continues to be stable. Dr. Hessie Diener discussed risks vs benefits of discharging patient on oral antibiotic regimen. Per ID, Augmentin and doxycycline course for 4 weeks if necessary. Today, I engaged in conversation with family again. Patient has some concerns about potential side effects,  especially about diarrhea and abdominal discomfort. They would like to discuss it amongst themselves and let team know about their decision. Will continue without antibiotics today. -Continue PT -Awaiting SNF placement -Code status updated to DNR/DNI, chaplain consulted for advanced directive   T2DM Stable cBG today -Continue Semglee 30 units with moderate SSI -Holding Metformin during this hospitalization    HTN Normotensive today -Toprol XL 100mg , hydralazine 10mg , amlodipine 10mg    AKI vs CKD Mildly elevated serum Cr and HFR 40-50 during this admission. No prior lab work on file but this could be in setting of CKD3a likely secondary to long standing T2DM vs HTN. No labs today as patient's lab work has been stable thus far. -Continue avoiding nephrotoxic medications  Hypothyroidism -Levothyroxine 68.   Prior to Admission Living Arrangement:Home Anticipated Discharge Location:SNF Barriers to Discharge:SNF placement Dispo: Anticipated discharge in approximately 1-2 day(s).   Morene Crocker, MD 01/29/2023, 6:38 AM Pager: [phone] After 5pm on weekdays and 1pm on weekends: On Call pager [phone]

## 2023-01-30 DIAGNOSIS — Z794 Long term (current) use of insulin: Secondary | ICD-10-CM | POA: Diagnosis not present

## 2023-01-30 DIAGNOSIS — Z515 Encounter for palliative care: Secondary | ICD-10-CM | POA: Diagnosis not present

## 2023-01-30 DIAGNOSIS — E1152 Type 2 diabetes mellitus with diabetic peripheral angiopathy with gangrene: Secondary | ICD-10-CM | POA: Diagnosis not present

## 2023-01-30 LAB — GLUCOSE, CAPILLARY
Glucose-Capillary: 108 mg/dL — ABNORMAL HIGH (ref 70–99)
Glucose-Capillary: 176 mg/dL — ABNORMAL HIGH (ref 70–99)
Glucose-Capillary: 144 mg/dL — ABNORMAL HIGH (ref 70–99)
Glucose-Capillary: 266 mg/dL — ABNORMAL HIGH (ref 70–99)

## 2023-01-30 MED ORDER — METOPROLOL SUCCINATE ER 100 MG PO TB24: 100.0000 mg | ORAL_TABLET | Freq: Every day | ORAL | 0 refills | Status: DC

## 2023-01-30 MED ORDER — DOXYCYCLINE HYCLATE 100 MG PO TABS
100.0000 mg | ORAL_TABLET | Freq: Two times a day (BID) | ORAL | Status: DC
  Administered 2023-01-30 – 2023-01-31 (×2): 100 mg via ORAL
  Filled 2023-01-30 (×2): qty 1

## 2023-01-30 MED ORDER — CYANOCOBALAMIN 500 MCG PO TABS: 500.0000 ug | ORAL_TABLET | Freq: Every day | ORAL | Status: DC

## 2023-01-30 MED ORDER — LOSARTAN POTASSIUM 25 MG PO TABS: 25.0000 mg | ORAL_TABLET | Freq: Every day | ORAL | Status: AC

## 2023-01-30 MED ORDER — AMOXICILLIN-POT CLAVULANATE 875-125 MG PO TABS: 1.0000 | ORAL_TABLET | Freq: Two times a day (BID) | ORAL | 0 refills | Status: DC

## 2023-01-30 MED ORDER — AMLODIPINE BESYLATE 10 MG PO TABS: 10.0000 mg | ORAL_TABLET | Freq: Every day | ORAL | Status: DC

## 2023-01-30 MED ORDER — MEDIHONEY WOUND/BURN DRESSING EX PSTE: 1.0000 | PASTE | Freq: Every day | CUTANEOUS | Status: DC

## 2023-01-30 MED ORDER — LEVOTHYROXINE SODIUM 137 MCG PO TABS: 68.5000 ug | ORAL_TABLET | Freq: Every day | ORAL | Status: DC

## 2023-01-30 MED ORDER — AMOXICILLIN-POT CLAVULANATE 875-125 MG PO TABS
1.0000 | ORAL_TABLET | Freq: Two times a day (BID) | ORAL | Status: DC
  Administered 2023-01-30 – 2023-01-31 (×2): 1 via ORAL
  Filled 2023-01-30 (×2): qty 1

## 2023-01-30 MED ORDER — DOXYCYCLINE HYCLATE 100 MG PO CAPS: 100.0000 mg | ORAL_CAPSULE | Freq: Two times a day (BID) | ORAL | 0 refills | Status: DC

## 2023-01-30 MED ORDER — GABAPENTIN 100 MG PO CAPS: 100.0000 mg | ORAL_CAPSULE | Freq: Every day | ORAL | Status: DC

## 2023-01-30 MED ORDER — LOSARTAN POTASSIUM 50 MG PO TABS
25.0000 mg | ORAL_TABLET | Freq: Every day | ORAL | Status: DC
  Administered 2023-01-30 – 2023-01-31 (×2): 25 mg via ORAL
  Filled 2023-01-30 (×2): qty 1

## 2023-01-30 NOTE — Discharge Instructions (Addendum)
Amber Dudley,  You came to the hospital for right heel gangrenous diabetic ulcer. We treated you with zosyn and daptomycin antibiotic therapy for 7 days here in the hospital.    Here are the changes that were made: Amlodipine 5 mg to 10 mg, take one tablet by mouth once daily  Levothyroxine 137 mcg, take half a tablet by mouth at 6 AM, remember do not take this with any other medications for at least one hour Toprol XL 100 mg, take one tablet by mouth every day  Start these new medications for Right heel Ulcer: Augmentin 875-125 mg, take one tablet by mouth twice a day for 28 days Vibramycin 100 mg, take one tablet by mouth every day for 28 days Gabapentin 100 mg, take one capsule by mouth everyday   Continue the rest of your medication as prescribed.   We are glad you are feeling better! Please return to the hospital if you begin to have any new onset chest pain, shortness of breath, fevers, or worsening of symptoms.   Thank you for allowing [place] to take part in your care.   Jeral Pinch, DO

## 2023-01-30 NOTE — TOC Progression Note (Addendum)
Transition of Care [facility]) - Progression Note    Patient Details  Name: Amber Dudley MRN: [id number] Date of Birth: [birth year]  Transition of Care [facility]) CM/SW Contact  Lorri Frederick, LCSW Phone Number: 01/30/2023, 11:15 AM  Clinical Narrative:   CSW spoke with pt regarding bed offers.  She would like private room, would be OK with [facility] or [facility].  CSW informed her that [facility] is only current offer, [facility] unable to offer bed.    CSW reached out to [facility] regarding private room, LM.  CSW contacted by [facility], who can communicate with [facility].  She will confirm if private room available.  CSW attempted to call several phone numbers for [facility].  No answer, unable to leave message.    1120: Message from [employer].  [facility] can offer bed, does have private room.  [facility] does not have private room.  Pt informed, son [name] now in room.  They are requesting that referral be sent to [facility], which was done.   1330: [facility].  They did receive referral but cannot offer a bed. Pt and son informed.  They would like to accept offer at [facility].  MD was in the room and is aware.  Patton Salles informed.    Medicare payer: inpatient order 01/21/23.    Expected Discharge Plan: Skilled Nursing Facility Barriers to Discharge: SNF Pending bed offer  Expected Discharge Plan and Services       Living arrangements for the past 2 months: Single Family Home                           HH Arranged: RN, PT, OT, Patient Refused HH, Social Work [facility] Agency: [facility] Date [facility] Agency Contacted: 01/26/23 Time HH Agency Contacted: 1607 Representative spoke with at [facility] Agency: Kandee Keen   Social Determinants of Health (SDOH) Interventions SDOH Screenings   Tobacco Use: Low Risk  (01/19/2023)    Readmission Risk Interventions     No data to display

## 2023-01-30 NOTE — Discharge Summary (Incomplete Revision)
Name: Amber Dudley MRN: [id number] DOB: 1930/09/03 89 y.o. PCP: Richarda Blade E  Date of Admission: 01/19/2023  1:43 PM Date of Discharge:  01/22/2023 Attending Physician: Dr. Mercie Eon  DISCHARGE DIAGNOSIS:  Primary Problem: Diabetic wet gangrene of the foot [facility])   Hospital Problems: Principal Problem:   Diabetic wet gangrene of the foot [facility]) Active Problems:   Hypothyroidism   Uncontrolled type 2 diabetes mellitus with hyperglycemia (HCC)   Diabetic infection of right foot (HCC)   Severe protein-calorie malnutrition (HCC)   Goals of care, counseling/discussion    DISCHARGE MEDICATIONS:   Allergies as of 01/30/2023   No Known Allergies      Medication List     STOP taking these medications    Vitamin B 12 100 MCG Lozg Replaced by: cyanocobalamin 500 MCG tablet       TAKE these medications    amLODipine 10 MG tablet Commonly known as: NORVASC Take 1 tablet (10 mg total) by mouth daily. Start taking on: January 31, 2023 What changed:  medication strength how much to take   amoxicillin-clavulanate 875-125 MG tablet Commonly known as: AUGMENTIN Take 1 tablet by mouth 2 (two) times daily for 28 days.   ascorbic acid 500 MG tablet Commonly known as: VITAMIN C Take 500 mg by mouth daily.   aspirin EC 81 MG tablet Take 81 mg by mouth daily.   BASAGLAR KWIKPEN [place] Inject 15-20 Units into the skin 2 (two) times daily. 15 units in the am, and 20 units at Night.   cyanocobalamin 500 MCG tablet Commonly known as: VITAMIN B12 Take 1 tablet (500 mcg total) by mouth daily. Start taking on: January 31, 2023 Replaces: Vitamin B 12 100 MCG Lozg   doxycycline 100 MG capsule Commonly known as: VIBRAMYCIN Take 1 capsule (100 mg total) by mouth 2 (two) times daily for 28 days.   furosemide 20 MG tablet Commonly known as: LASIX Take 20 mg by mouth.   gabapentin 100 MG capsule Commonly known as: NEURONTIN Take 1 capsule (100 mg total) by mouth daily. Start  taking on: January 31, 2023   leptospermum manuka honey Pste paste Apply 1 Application topically daily. Start taking on: January 31, 2023   levothyroxine 137 MCG tablet Commonly known as: SYNTHROID Take 0.5 tablets (68.5 mcg total) by mouth daily at 6 (six) AM. Start taking on: January 31, 2023 What changed:  medication strength how much to take when to take this   liothyronine 5 MCG tablet Commonly known as: CYTOMEL Take 5 mcg by mouth daily.   losartan 25 MG tablet Commonly known as: COZAAR Take 1 tablet (25 mg total) by mouth daily. Start taking on: January 31, 2023 What changed:  medication strength how much to take   metFORMIN 1000 MG tablet Commonly known as: GLUCOPHAGE Take 1,000 mg by mouth 2 (two) times daily with a meal.   metoprolol succinate 100 MG 24 hr tablet Commonly known as: TOPROL-XL Take 1 tablet (100 mg total) by mouth daily. Take with or immediately following a meal. Start taking on: January 31, 2023 What changed:  medication strength how much to take   multivitamin capsule Take 1 capsule by mouth daily.   pravastatin 10 MG tablet Commonly known as: PRAVACHOL Take 10 mg by mouth every other day.   Vitamin D (Ergocalciferol) 50 MCG (2000 UT) Caps Take 1 capsule by mouth daily.               Discharge Care Instructions  (  From admission, onward)           Start     Ordered   01/30/23 0000  Leave dressing on - Keep it clean, dry, and intact until clinic visit        01/30/23 1557            DISPOSITION AND FOLLOW-UP:  Amber Dudley was discharged from [facility] in Good condition. At the hospital follow up visit please address:  1.  Diabetic wet gangrene as calcaneal ulcer: Patient was discharged on Augmentin and doxycycline for 28 days.  Ensure patient completes course.  Continue to follow-up wound care as well as ensure infection is not worsening.  2.  Hypothyroidism: Patient discharged on levothyroxine, patient  was not taking home levothyroxine dosage, and was started on 68.5 mcg during hospital stay.  Continue to titrate outpatient as tolerated.  Follow-up TSH in about 6-8 weeks.  3.  AKI versus CKD: Patient did have elevated creatinine during hospitalization.  Unclear if this is due to AKI versus CKD.  Did hold any nephrotoxic agents, did discharge patient on 25 the losartan.  4.  Type 2 diabetes mellitus: Resume metformin on discharge.  Continue to titrate diabetic medications outpatient.  Follow-up Recommendations: Consults: PCP, wound care  Labs: Basic Metabolic Profile and CBC, TSH  Studies: N/A Medications: Discharged on Augmentin as well as doxycycline.  Follow-up Appointments:  Follow-up Information     Richarda Blade E Follow up in 1 month(s).   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 Xray showed soft tissue ulceration overlying the plantar surface of the calcaneus and subcutaneous gas. MRI showed arge plantar heel ulceration with internal gas and hypo-enhancement measuring about 3.0 by 4.5 by 4.2 cm. Extends to involve the medial band of the plantar fascia with subtle reactive edema in the endosteal region of the left posteroinferior calcaneus. After obtaining ABIs which were indicative of PAD in her lower extremities, orthopedics recommended AKA of right lower extremity. Patient is refusing surgery, she has capacity. Vascular surgery recommends against arteriogram.  Blood cultures are NGTD, A1c 8.1%, no leukocytosis. Patient received 7 days of daptomycin and zosyn. Palliative care consulted. Patient decided to go to SNF with code status change to DNR, she does not wish  to manage the infection with an AKA. Patient and Son at bedside decided for 4 weeks course of PO Doxycycline and Augmentin antibiotics therapy.   #Lower extremity chronic edema Patient has a long standing history of chronic LE edema. Obtained echocardiogram which demonstrated LVEF 60-65% with grade I diastolic dysfunction, and left atrial dilation, moderate. Most likely due to vascular disease and venous stasis.   #Hypothyroidism Patient reports being off her levothyroxine for a while. TSH was 8.051, began levothyroxine at 68.23mcg.   #AKI vs CKD Patient's Cr was elevated upon admission. Throughout her hospitalization, her serum creatinine remained elevated which suggests a degree of CKD instead of AKI due to chronically elevated serum Creatinine, while we are holding nephrotoxic agents and she has good oral intake.   #Hypertension Patient was on losartan at home but was held for a suspected AKI. During her hospitalization, we increased the toprol XL  to 100mg  and amlodipine to 10mg . We added hydralazine 10mg  to limit hypertensive episodes.   #T2DM During hospitalization, home metformin was held. A1c is 8.1%.      DISCHARGE INSTRUCTIONS:   Discharge Instructions     Call MD for:  difficulty breathing, headache or visual disturbances   Complete by: As directed    Call MD for:  extreme fatigue   Complete by: As directed    Call MD for:  hives   Complete by: As directed    Call MD for:  persistant dizziness or light-headedness   Complete by: As directed    Call MD for:  persistant nausea and vomiting   Complete by: As directed    Call MD for:  redness, tenderness, or signs of infection (pain, swelling, redness, odor or green/yellow discharge around incision site)   Complete by: As directed    Call MD for:  severe uncontrolled pain   Complete by: As directed    Call MD for:  temperature >100.4   Complete by: As directed    Diet - low sodium heart healthy   Complete by: As directed     Discharge instructions   Complete by: As directed    Hello Amber Dudley,  You came to the hospital for right heel gangrenous diabetic ulcer. We treated you with zosyn and daptomycin antibiotic therapy for 7 days here in the hospital.    Here are the changes that were made: 1. Amlodipine 5 mg to 10 mg, take one tablet by mouth once daily  2. Levothyroxine 137 mcg, take one tablet by mouth at 6 AM. Remember to not take this medication with any other medications for an hour.  3. Toprol XL 100 mg, take one tablet by mouth every day  Start these new medications for Right heel Ulcer: 1. Augmentin 875-125 mg, take one tablet by mouth twice a day for 28 days 2. Vibramycin 100 mg, take one tablet by mouth every day for 28 days 3. Gabapentin 100 mg, take one capsule by mouth everyday   Continue the rest of your medication as prescribed.   We are glad you are feeling better! Please return to the hospital if you begin to have any new onset chest pain, shortness of breath, fevers, or worsening of symptoms.   Thank you for allowing [place] to take part in your care.    , DO   Increase activity slowly   Complete by: As directed    Leave dressing on - Keep it clean, dry, and intact until clinic visit   Complete by: As directed      Amber Dudley,  You came to the hospital for right heel gangrenous diabetic ulcer. We treated you with zosyn and daptomycin antibiotic therapy for 7 days here in the hospital.    Here are the changes that were made: Amlodipine 5 mg to 10 mg, take one tablet by mouth once daily  Levothyroxine 137 mcg, take half a tablet by mouth at 6 AM, remember do not take this with any other medications for at least one hour Toprol XL 100 mg, take one tablet by mouth every day  Start these new medications for Right heel Ulcer: Augmentin 875-125 mg, take one tablet by mouth twice a day for 28 days Vibramycin 100 mg, take one tablet by mouth every day for 28 days Gabapentin  100 mg, take one capsule by mouth everyday   Continue the rest of your medication as prescribed.   We  are glad you are feeling better! Please return to the hospital if you begin to have any new onset chest pain, shortness of breath, fevers, or worsening of symptoms.   Thank you for allowing [place] to take part in your care.    , DO   SUBJECTIVE:  Patient was seen at bedside today reports doing overall well. Denies any acute episodes of shortness of breath and chest pain. Son was not present at bedside.   At noon, patient was seen again with son at bedside. Son wants continue with the antibiotic regimen, PO Augmentin and Doxycycline for 4 weeks as recommended by ID. Son and patient acknowledges the potential side effects for long term antibiotic use. Patient wants to go to [facility] for SNF placement.   Discharge Vitals:   BP 133/62 (BP Location: Left Arm)   Pulse 76   Temp 98.2 F (36.8 C) (Oral)   Resp 18   Ht 5\' 3"  (1.6 m)   Wt 80.7 kg   SpO2 95%   BMI 31.52 kg/m   OBJECTIVE:  Physical Exam   General: resting comfortably, NAD, alert Cardiac:RRR, no murmurs auscultated  Pulmonary:clear to auscultate b/l, normal effort  MSK:b/l edema improved after compression with SCD, discoloration unchanged. Right calcaneal ulcer is wrapped with dressing.  Abd: non distended, non tender, 2+ BS present Skin:warm and dry Psych: Pleasant mood, normal affect  Pertinent Labs, Studies, and Procedures:     Latest Ref Rng & Units 01/28/2023    2:47 AM 01/27/2023    2:51 AM 01/26/2023    7:37 AM  CBC  WBC 4.0 - 10.5 K/uL 6.6  7.1  6.0   Hemoglobin 12.0 - 15.0 g/dL 9.8  9.1  9.6   Hematocrit 36.0 - 46.0 % 31.0  29.2  29.8   Platelets 150 - 400 K/uL 429  410  450        Latest Ref Rng & Units 01/28/2023    2:47 AM 01/27/2023    2:51 AM 01/26/2023    7:37 AM  CMP  Glucose 70 - 99 mg/dL 696  96  82   BUN 8 - 23 mg/dL 25  25  16    Creatinine 0.44 - 1.00 mg/dL 2.95  2.84  1.32    Sodium 135 - 145 mmol/L 136  137  140   Potassium 3.5 - 5.1 mmol/L 4.7  4.3  4.3   Chloride 98 - 111 mmol/L 104  104  105   CO2 22 - 32 mmol/L 25  26  24    Calcium 8.9 - 10.3 mg/dL 8.6  8.5  8.7     MR FOOT RIGHT W WO CONTRAST  Result Date: 01/20/2023 CLINICAL DATA:  Diabetic foot swelling, gangrene, foot wound. EXAM: MRI OF THE RIGHT FOOT WITHOUT AND WITH CONTRAST TECHNIQUE: Multiplanar, multisequence MR imaging of the right foot was performed before and after the administration of intravenous contrast. Osteomyelitis protocol MRI of the foot was obtained, to include the entire foot and ankle. This protocol uses a large field of view to cover the entire foot and ankle, and is suitable for assessing bony structures for osteomyelitis. Due to the large field of view and imaging plane choice, this protocol is less sensitive for assessing small structures such as ligamentous structures of the foot and ankle, compared to a dedicated forefoot or dedicated hindfoot exam. CONTRAST:  8mL GADAVIST GADOBUTROL 1 MMOL/ML IV SOLN COMPARISON:  Radiographs 01/19/2023 FINDINGS: Despite efforts by the technologist and patient, motion artifact  is present on today's exam and could not be eliminated. This reduces exam sensitivity and specificity. Bones/Joint/Cartilage Metal artifact in the second toe likely related to the small needle shown on conventional radiography. Scattered degenerative findings in the hindfoot and midfoot. Degenerative subcortical cystic lesion along the base of the fourth metatarsal. Likely reactive endosteal edema and enhancement along the posterior plantar margin of the calcaneus. This is in proximity to severe ulceration, but is less than I would expect in the setting of true osteomyelitis. Degenerative findings along the articulation of the navicular with the medial cuneiform. Ligaments Poorly characterized on today's large field of view examination. Muscles and Tendons Hypoenhancing ulceration  along the plantar foot extends up to involve the medial band of the plantar fascia on image 19 series 7. Distal Achilles tendon intact. No other specific tendon abnormality observed. Generalized low-level muscular edema the foot, probably neurogenic. Soft tissues Large plantar heel ulceration with internal gas and hypoenhancement measuring about 3.0 by 4.5 by 4.2 cm. This appears to be draining to the cutaneous surface, and extends to involve the medial band of the plantar fascia with subtle reactive edema in the endosteal region of the left posteroinferior calcaneus. Suspected second small focus of ulceration along the lateral plantar foot on image 9 series 11, about the level of the Lisfranc joint. Metal artifact in the second toe likely related to the known needle shaped foreign body. IMPRESSION: 1. Large plantar heel ulceration with internal gas and hypoenhancement measuring about 3.0 by 4.5 by 4.2 cm. This appears to be draining to the cutaneous surface, and extends to involve the medial band of the plantar fascia with subtle reactive edema in the endosteal region of the left posteroinferior calcaneus. 2. Suspected second small focus of ulceration along the lateral plantar foot about the level of the Lisfranc joint. 3. Metal artifact in the second toe likely related to the known needle shaped foreign body shown on conventional radiography. 4. Scattered degenerative findings in the hindfoot and midfoot. 5. Generalized low-level muscular edema the foot, probably neurogenic. Electronically Signed   By: Gaylyn Rong M.D.   On: 01/20/2023 16:12   VAS [place] ABI WITH/WO TBI  Result Date: 01/20/2023  LOWER EXTREMITY DOPPLER STUDY Patient Name:  Amber Dudley  Date of Exam:   01/20/2023 Medical Rec #: [id number]      Accession #:    [id number] Date of Birth: [birth year]       Patient Gender: F Patient Age:   50 years Exam Location:  [facility] Procedure:      VAS [place] ABI WITH/WO TBI Referring Phys: JEFFREY  HATCHER --------------------------------------------------------------------------------  Indications: Ulceration. High Risk Factors: Hypertension, hyperlipidemia, Diabetes.  Limitations: Today's exam was limited due to patient intolerant to cuff              pressure, an open wound, involuntary patient movement and skin              induration, L arm IV. Comparison Study: No prior studies. Performing Technologist: Olen Cordial RVT  Examination Guidelines: A complete evaluation includes at minimum, Doppler waveform signals and systolic blood pressure reading at the level of bilateral brachial, anterior tibial, and posterior tibial arteries, when vessel segments are accessible. Bilateral testing is considered an integral part of a complete examination. Photoelectric Plethysmograph (PPG) waveforms and toe systolic pressure readings are included as required and additional duplex testing as needed. Limited examinations for reoccurring indications may be performed as noted.  ABI Findings: +---------+------------------+-----+----------+--------+ Right  Rt Pressure (mmHg)IndexWaveform  Comment  +---------+------------------+-----+----------+--------+ Brachial                                  IV       +---------+------------------+-----+----------+--------+ PTA      247               1.56 monophasic         +---------+------------------+-----+----------+--------+ DP       130               0.82 monophasic         +---------+------------------+-----+----------+--------+ Great Toe51                0.32                    +---------+------------------+-----+----------+--------+ +---------+------------------+-----+----------+-------+ Left     Lt Pressure (mmHg)IndexWaveform  Comment +---------+------------------+-----+----------+-------+ Brachial 158                    triphasic         +---------+------------------+-----+----------+-------+ PTA      237               1.50  monophasic        +---------+------------------+-----+----------+-------+ DP       254               1.61 monophasic        +---------+------------------+-----+----------+-------+ Great Toe62                0.39                   +---------+------------------+-----+----------+-------+ +-------+-----------+-----------+------------+------------+ ABI/TBIToday's ABIToday's TBIPrevious ABIPrevious TBI +-------+-----------+-----------+------------+------------+ Right  [place]         0.32                                +-------+-----------+-----------+------------+------------+ Left   [place]         0.39                                +-------+-----------+-----------+------------+------------+   Summary: Right: Resting right ankle-brachial index indicates noncompressible right lower extremity arteries. The right toe-brachial index is abnormal. Left: Resting left ankle-brachial index indicates noncompressible left lower extremity arteries. The left toe-brachial index is abnormal. *See table(s) above for measurements and observations.  Electronically signed by Sherald Hess MD on 01/20/2023 at 2:10:57 PM.    Final    ECHOCARDIOGRAM COMPLETE  Result Date: 01/20/2023    ECHOCARDIOGRAM REPORT   Patient Name:   Amber Dudley Date of Exam: 01/20/2023 Medical Rec #:  [id number]     Height:       63.0 in Accession #:    [id number]    Weight:       182.1 lb Date of Birth:  1930/09/20      BSA:          1.858 m Patient Age:    89 years      BP:           162/96 mmHg Patient Gender: F             HR:           81 bpm. Exam Location:  Inpatient Procedure: 2D Echo, Cardiac Doppler and  Color Doppler Indications:    Other abnormalities of the heart R00.8  History:        Patient has no prior history of Echocardiogram examinations.                 PAD; Risk Factors:Non-Smoker, Diabetes and Dyslipidemia.  Sonographer:    Dondra Prader RVT RCS Referring Phys: [id number] JEFFREY C HATCHER  Sonographer Comments: Patient  done in supine position IMPRESSIONS  1. Left ventricular ejection fraction, by estimation, is 60 to 65%. The left ventricle has normal function. The left ventricle has no regional wall motion abnormalities. Left ventricular diastolic parameters are consistent with Grade I diastolic dysfunction (impaired relaxation).  2. Right ventricular systolic function is normal. The right ventricular size is normal.  3. Left atrial size was moderately dilated.  4. The mitral valve is normal in structure. Mild mitral valve regurgitation. No evidence of mitral stenosis.  5. The aortic valve is tricuspid. There is mild calcification of the aortic valve. Aortic valve regurgitation is not visualized. Aortic valve sclerosis/calcification is present, without any evidence of aortic stenosis.  6. The inferior vena cava is normal in size with greater than 50% respiratory variability, suggesting right atrial pressure of 3 mmHg. FINDINGS  Left Ventricle: Left ventricular ejection fraction, by estimation, is 60 to 65%. The left ventricle has normal function. The left ventricle has no regional wall motion abnormalities. The left ventricular internal cavity size was normal in size. There is  no left ventricular hypertrophy. Left ventricular diastolic parameters are consistent with Grade I diastolic dysfunction (impaired relaxation). Right Ventricle: The right ventricular size is normal. No increase in right ventricular wall thickness. Right ventricular systolic function is normal. Left Atrium: Left atrial size was moderately dilated. Right Atrium: Right atrial size was normal in size. Pericardium: There is no evidence of pericardial effusion. Mitral Valve: The mitral valve is normal in structure. Mild mitral valve regurgitation. No evidence of mitral valve stenosis. Tricuspid Valve: The tricuspid valve is normal in structure. Tricuspid valve regurgitation is trivial. No evidence of tricuspid stenosis. Aortic Valve: The aortic valve is  tricuspid. There is mild calcification of the aortic valve. Aortic valve regurgitation is not visualized. Aortic valve sclerosis/calcification is present, without any evidence of aortic stenosis. Aortic valve mean gradient measures 6.0 mmHg. Aortic valve peak gradient measures 10.2 mmHg. Aortic valve area, by VTI measures 2.36 cm. Pulmonic Valve: The pulmonic valve was normal in structure. Pulmonic valve regurgitation is not visualized. No evidence of pulmonic stenosis. Aorta: The aortic root is normal in size and structure. Venous: The inferior vena cava is normal in size with greater than 50% respiratory variability, suggesting right atrial pressure of 3 mmHg. IAS/Shunts: No atrial level shunt detected by color flow Doppler.  LEFT VENTRICLE PLAX 2D LVIDd:         5.10 cm   Diastology LVIDs:         3.80 cm   LV e' medial:    7.40 cm/s LV PW:         0.90 cm   LV E/e' medial:  10.7 LV IVS:        1.00 cm   LV e' lateral:   7.94 cm/s LVOT diam:     2.10 cm   LV E/e' lateral: 9.9 LV SV:         80 LV SV Index:   43 LVOT Area:     3.46 cm  RIGHT VENTRICLE RV Basal diam:  3.00 cm LEFT ATRIUM  Index        RIGHT ATRIUM           Index LA diam:        4.50 cm 2.42 cm/m   RA Area:     10.40 cm LA Vol (A2C):   54.9 ml 29.55 ml/m  RA Volume:   21.00 ml  11.30 ml/m LA Vol (A4C):   44.6 ml 24.00 ml/m LA Biplane Vol: 52.3 ml 28.15 ml/m  AORTIC VALVE                     PULMONIC VALVE AV Area (Vmax):    2.29 cm      PV Vmax:       1.18 m/s AV Area (Vmean):   2.31 cm      PV Peak grad:  5.6 mmHg AV Area (VTI):     2.36 cm AV Vmax:           160.00 cm/s AV Vmean:          108.000 cm/s AV VTI:            0.341 m AV Peak Grad:      10.2 mmHg AV Mean Grad:      6.0 mmHg LVOT Vmax:         106.00 cm/s LVOT Vmean:        71.900 cm/s LVOT VTI:          0.232 m LVOT/AV VTI ratio: 0.68  AORTA Ao Root diam: 2.50 cm Ao Asc diam:  3.50 cm MITRAL VALVE MV Area (PHT): 3.85 cm     SHUNTS MV Decel Time: 197 msec      Systemic VTI:  0.23 m MV E velocity: 78.90 cm/s   Systemic Diam: 2.10 cm MV A velocity: 123.00 cm/s MV E/A ratio:  0.64 Arvilla Meres MD Electronically signed by Arvilla Meres MD Signature Date/Time: 01/20/2023/12:05:09 PM    Final    DG Foot Complete Right  Result Date: 01/19/2023 CLINICAL DATA:  Pain in right foot. EXAM: RIGHT FOOT COMPLETE - 3+ VIEW COMPARISON:  None Available. FINDINGS: There is marked diffuse soft tissue edema about the right foot. Soft tissue ulceration overlying the plantar surface of the calcaneus is identified with overlying bandage material and subcutaneous gas. No signs focal bony erosion of the calcaneus. Suspected needle fragment noted within the soft tissues along the plantar surface of the second digit at the level of the second middle phalanx Second through fifth hammertoe deformities. No signs of acute fracture. Degenerative changes are identified between the tarsal bones. IMPRESSION: 1. Soft tissue ulceration overlying the plantar surface of the calcaneus with overlying bandage material and subcutaneous gas. 2. No signs of focal bony erosion to suggest osteomyelitis. 3. Suspected needle fragment within the soft tissues along the plantar surface of the second digit at the level of the second middle phalanx. 4. Marked diffuse soft tissue edema about the right foot. 5. Second through fifth hammertoe deformities. Electronically Signed   By: Signa Kell M.D.   On: 01/19/2023 15:10     Signed: Jeral Pinch, D.O.  Internal Medicine Resident, PGY-1 [facility]  Pager: [phone] 3:58 PM, 01/30/2023

## 2023-01-30 NOTE — Progress Notes (Addendum)
HD#9 SUBJECTIVE:  Patient Summary: Amber Dudley is a 89 y.o. female with past medical history of type 2 diabetes with neuropathy, hypertension, hyperlipidemia, hypothyroidism, chronic lower extremity edema who initially presented with right heel wound and was admitted for right calcaneal subacute wet gangrenous diabetic ulcer. MRI showed Large plantar heel ulceration with internal gas and hypo-enhancement measuring about 3.0 by 4.5 by 4.2 cm without osteomyelitis.   Overnight Events: None  Interim History:  Patient was seen at bedside this morning, reports doing overall well. Denies any acute episodes of shortness of breath and chest pain. Denies any other acute complains. Son was not present at bedside.    At noon, patient was seen again with son at bedside. Son wants continue with the antibiotic regimen, PO Augmentin and Doxycycline for 4 weeks as recommended by ID. Son and patient acknowledges the potential side effects for long term antibiotic use. Patient wants to go to [facility] for SNF placement.    OBJECTIVE:  Vital Signs: Vitals:   01/30/23 0336 01/30/23 0608 01/30/23 0720 01/30/23 1351  BP: (!) 174/73  (!) 162/70 133/62  Pulse: 74  67 76  Resp: 18  18 18   Temp: 98.1 F (36.7 C)  98.5 F (36.9 C) 98.2 F (36.8 C)  TempSrc: Oral  Oral Oral  SpO2: 96%  97% 95%  Weight:  80.7 kg    Height:       Supplemental O2: Room Air SpO2: 95 %  Filed Weights   01/19/23 1943 01/19/23 2014 01/30/23 0608  Weight: 82 kg 82.6 kg 80.7 kg    No intake or output data in the 24 hours ending 01/30/23 1627 Net IO Since Admission: 975 mL [01/30/23 1627]  Physical Exam: Physical Exam  General: resting comfortably, NAD, alert Cardiac:RRR, no murmurs auscultated  Pulmonary:clear to auscultate b/l, normal effort  MSK:b/l edema improved after compression with SCD, discoloration unchanged. Right calcaneal ulcer is wrapped with dressing.  Abd: non distended, non tender, 2+ BS  present Skin:warm and dry Psych: Pleasant mood, normal affect   Patient Lines/Drains/Airways Status     Active Line/Drains/Airways     Name Placement date Placement time Site Days   Peripheral IV 01/24/23 22 G 1.75" Anterior;Left Forearm 01/24/23  0426  Forearm  6   Wound / Incision (Open or Dehisced) 01/20/23 Heel Right chronic full thickness wound 01/20/23  --  Heel  10             ASSESSMENT/PLAN:  Assessment: Principal Problem:   Diabetic wet gangrene of the foot [facility]) Active Problems:   Hypothyroidism   Uncontrolled type 2 diabetes mellitus with hyperglycemia (HCC)   Diabetic infection of right foot (HCC)   Severe protein-calorie malnutrition (HCC)   Goals of care, counseling/discussion   Plan:  Right diabetic gangrenous ulcer right heel Goals of Care  MRI showed large plantar heel ulceration with internal gas and hypo enhancement measuring about 3.0 by 4.5 by 4.2 cm without osteomyelitis. Patient decided against [facility] after multiple discussions with IMTS, palliative care, orthopedic surgery, and social work. She is now s/p 7 day course of zosyn and daptomycin antibiotic therapy on 7/24. Per ID, Augmentin and doxycycline course for 4 weeks if necessary. Today, I engaged in conversation with family with regards to antibiotic therapy. Family wishes to continue with the regimen at the SNF.  -Continue PT - SNF placement at [facility] - Code status updated to DNR/DNI    T2DM Stable cBG today -Continue Semglee 30 units  with moderate SSI -Holding Metformin during this hospitalization    HTN Normotensive today -Toprol XL 100mg , hydralazine 10mg , amlodipine 10mg     AKI vs CKD Mildly elevated serum Cr and HFR 40-50 during this admission. No prior lab work on file but this could be in setting of CKD3a likely secondary to long standing T2DM vs HTN. No labs today as patient's lab work has been stable thus far. -Continue avoiding nephrotoxic medications    Hypothyroidism -Levothyroxine 68.    Best Practice: Diet: Modified Carb diet IVF: Fluids: None, Rate: None VTE: enoxaparin (LOVENOX) injection 40 mg Start: 01/20/23 1800 SCDs Start: 01/19/23 1825 Code: DNR DISPO: Anticipated discharge tomorrow to Skilled nursing facility.   Signature: Jeral Pinch, D.O.  Internal Medicine Resident, PGY-1 [facility]  Pager: [phone] 4:27 PM, 01/30/2023   Please contact the on call pager after 5 pm and on weekends at [phone].     Internal Medicine Attending:   I was physically present during the key portions of the resident provided service and participated in medical decision making of the patient's management care in the assessment and plan.    I anticipate d/c to SNF on 7/30

## 2023-01-30 NOTE — Plan of Care (Signed)
  Problem: Health Behavior/Discharge Planning: Goal: Ability to manage health-related needs will improve Outcome: Progressing   Problem: Metabolic: Goal: Ability to maintain appropriate glucose levels will improve Outcome: Progressing   Problem: Skin Integrity: Goal: Risk for impaired skin integrity will decrease Outcome: Progressing

## 2023-01-30 NOTE — Discharge Summary (Addendum)
Name: Amber Dudley MRN: [id number] DOB: [birth year] 89 y.o. PCP: Amber Dudley  Date of Admission: 01/19/2023  1:43 PM Date of Discharge:  01/31/2023 Attending Physician: Dr. Mercie Eon  DISCHARGE DIAGNOSIS:  Primary Problem: Diabetic wet gangrene of the foot [facility])   Hospital Problems: Principal Problem:   Diabetic wet gangrene of the foot [facility]) Active Problems:   Hypothyroidism   Uncontrolled type 2 diabetes mellitus with hyperglycemia (HCC)   Diabetic infection of right foot (HCC)   Severe protein-calorie malnutrition (HCC)   Goals of care, counseling/discussion    DISCHARGE MEDICATIONS:   Allergies as of 01/31/2023   No Known Allergies      Medication List     STOP taking these medications    Vitamin B 12 100 MCG Lozg Replaced by: cyanocobalamin 500 MCG tablet       TAKE these medications    amLODipine 10 MG tablet Commonly known as: NORVASC Take 1 tablet (10 mg total) by mouth daily. What changed:  medication strength how much to take   amoxicillin-clavulanate 875-125 MG tablet Commonly known as: AUGMENTIN Take 1 tablet by mouth 2 (two) times daily for 28 days.   ascorbic acid 500 MG tablet Commonly known as: VITAMIN C Take 500 mg by mouth daily.   aspirin EC 81 MG tablet Take 81 mg by mouth daily.   BASAGLAR KWIKPEN [place] Inject 15-20 Units into the skin 2 (two) times daily. 15 units in the am, and 20 units at Night.   cyanocobalamin 500 MCG tablet Commonly known as: VITAMIN B12 Take 1 tablet (500 mcg total) by mouth daily. Replaces: Vitamin B 12 100 MCG Lozg   doxycycline 100 MG capsule Commonly known as: VIBRAMYCIN Take 1 capsule (100 mg total) by mouth 2 (two) times daily for 28 days.   furosemide 20 MG tablet Commonly known as: LASIX Take 20 mg by mouth.   gabapentin 100 MG capsule Commonly known as: NEURONTIN Take 1 capsule (100 mg total) by mouth daily.   leptospermum manuka honey Pste paste Apply 1 Application topically  daily.   levothyroxine 137 MCG tablet Commonly known as: SYNTHROID Take 0.5 tablets (68.5 mcg total) by mouth daily at 6 (six) AM. What changed:  medication strength how much to take when to take this   liothyronine 5 MCG tablet Commonly known as: CYTOMEL Take 5 mcg by mouth daily.   losartan 25 MG tablet Commonly known as: COZAAR Take 1 tablet (25 mg total) by mouth daily. What changed:  medication strength how much to take   metFORMIN 1000 MG tablet Commonly known as: GLUCOPHAGE Take 1,000 mg by mouth 2 (two) times daily with a meal.   metoprolol succinate 100 MG 24 hr tablet Commonly known as: TOPROL-XL Take 1 tablet (100 mg total) by mouth daily. Take with or immediately following a meal. What changed:  medication strength how much to take   multivitamin capsule Take 1 capsule by mouth daily.   pravastatin 10 MG tablet Commonly known as: PRAVACHOL Take 10 mg by mouth every other day.   Vitamin D (Ergocalciferol) 50 MCG (2000 UT) Caps Take 1 capsule by mouth daily.               Discharge Care Instructions  (From admission, onward)           Start     Ordered   01/30/23 0000  Leave dressing on - Keep it clean, dry, and intact until clinic visit  01/30/23 1557            DISPOSITION AND FOLLOW-UP:  AmberKaile V Dudley was discharged from [facility] in Good condition. At the hospital follow up visit please address:  1.  Diabetic wet gangrene as calcaneal ulcer: Patient was discharged on Augmentin and doxycycline for 28 days.  Ensure patient completes course.  Continue to follow-up wound care as well as ensure infection is not worsening.  2.  Hypothyroidism: Patient discharged on levothyroxine, patient was not taking home levothyroxine dosage, and was started on 68.5 mcg during hospital stay.  Continue to titrate outpatient as tolerated.  Follow-up TSH in about 6-8 weeks.  3.  AKI versus CKD: Patient did have elevated  creatinine during hospitalization.  Unclear if this is due to AKI versus CKD.  Did hold any nephrotoxic agents, did discharge patient on 25 the losartan.  4.  Type 2 diabetes mellitus: Resume metformin on discharge.  Continue to titrate diabetic medications outpatient. Goal glucose less than 180 to optimize great wound healing.   Follow-up Recommendations: Consults: PCP, wound care  Labs: Basic Metabolic Profile and CBC, TSH  Studies: N/A Medications: Discharged on Augmentin as well as doxycycline.  Follow-up Appointments:  Follow-up Information     Amber Dudley Follow up in 1 month(s).   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 Xray showed soft tissue ulceration overlying the plantar surface of the calcaneus and subcutaneous gas. MRI showed arge plantar heel ulceration with internal gas and hypo-enhancement measuring about 3.0 by 4.5 by 4.2 cm. Extends to involve the medial band of the plantar fascia with subtle reactive edema in the endosteal region of the left posteroinferior calcaneus. After obtaining ABIs which were indicative of PAD in her lower extremities, orthopedics recommended AKA of right lower extremity. Patient is refusing surgery, she has capacity. Vascular surgery recommends against arteriogram.  Blood cultures are NGTD, A1c 8.1%, no leukocytosis. Patient received 7 days of daptomycin and zosyn. Palliative care consulted. Patient decided to go to SNF with code status change to DNR, she does not wish to manage the infection with an AKA. Patient and Son at bedside decided for 4 weeks course of PO Doxycycline and Augmentin antibiotics therapy.   #Lower  extremity chronic edema Patient has a long standing history of chronic LE edema. Obtained echocardiogram which demonstrated LVEF 60-65% with grade I diastolic dysfunction, and left atrial dilation, moderate. Most likely due to vascular disease and venous stasis.   #Hypothyroidism Patient reports being off her levothyroxine for a while. TSH was 8.051, began levothyroxine at 68.58mcg.   #AKI vs CKD Patient's Cr was elevated upon admission. Throughout her hospitalization, her serum creatinine remained elevated which suggests a degree of CKD instead of AKI due to chronically elevated serum Creatinine, while we are holding nephrotoxic agents and she has good oral intake.   #Hypertension Patient was on losartan at home but was held for a suspected AKI. During her hospitalization, we increased the toprol XL to 100mg  and amlodipine to 10mg . We added hydralazine 10mg  to limit hypertensive episodes.   #T2DM During hospitalization, home metformin was held. Continue on Insuline. A1c is 8.1%. No major concerns for  very high glucose levels during admission.      DISCHARGE INSTRUCTIONS:   Discharge Instructions     Call MD for:  difficulty breathing, headache or visual disturbances   Complete by: As directed    Call MD for:  extreme fatigue   Complete by: As directed    Call MD for:  hives   Complete by: As directed    Call MD for:  persistant dizziness or light-headedness   Complete by: As directed    Call MD for:  persistant nausea and vomiting   Complete by: As directed    Call MD for:  redness, tenderness, or signs of infection (pain, swelling, redness, odor or green/yellow discharge around incision site)   Complete by: As directed    Call MD for:  severe uncontrolled pain   Complete by: As directed    Call MD for:  temperature >100.4   Complete by: As directed    Diet - low sodium heart healthy   Complete by: As directed    Discharge instructions   Complete by: As directed    Hello Ms.  Milillo,  You came to the hospital for right heel gangrenous diabetic ulcer. We treated you with zosyn and daptomycin antibiotic therapy for 7 days here in the hospital.    Here are the changes that were made: 1. Amlodipine 5 mg to 10 mg, take one tablet by mouth once daily  2. Levothyroxine 137 mcg, take one tablet by mouth at 6 AM. Remember to not take this medication with any other medications for an hour.  3. Toprol XL 100 mg, take one tablet by mouth every day  Start these new medications for Right heel Ulcer: 1. Augmentin 875-125 mg, take one tablet by mouth twice a day for 28 days 2. Vibramycin 100 mg, take one tablet by mouth every day for 28 days 3. Gabapentin 100 mg, take one capsule by mouth everyday   Continue the rest of your medication as prescribed.   We are glad you are feeling better! Please return to the hospital if you begin to have any new onset chest pain, shortness of breath, fevers, or worsening of symptoms.   Thank you for allowing [place] to take part in your care.    , DO   Increase activity slowly   Complete by: As directed    Leave dressing on - Keep it clean, dry, and intact until clinic visit   Complete by: As directed      Ms. Ibsen,  You came to the hospital for right heel gangrenous diabetic ulcer. We treated you with zosyn and daptomycin antibiotic therapy for 7 days here in the hospital.    Here are the changes that were made: Amlodipine 5 mg to 10 mg, take one tablet by mouth once daily  Levothyroxine 137 mcg, take half a tablet by mouth at 6 AM, remember do not take this with any other medications for at least one hour Toprol XL 100 mg, take one tablet by mouth every day  Start these new medications for Right heel Ulcer: Augmentin 875-125 mg, take one tablet by mouth twice a day for 28 days Vibramycin 100 mg, take one tablet by mouth every day for 28 days Gabapentin 100 mg, take one capsule by mouth everyday   Continue the rest of  your medication as prescribed.   We are glad you are feeling better! Please return to the hospital if you begin to have any new onset chest  pain, shortness of breath, fevers, or worsening of symptoms.   Thank you for allowing [place] to take part in your care.    , DO   SUBJECTIVE:  Patient was seen at bedside today reports doing overall well. Denies any acute episodes of shortness of breath and chest pain. Son was not present at bedside. States she is ready to go to SNF.   Discharge Vitals:   BP (!) 156/85 (BP Location: Left Arm)   Pulse 80   Temp 98 F (36.7 C) (Oral)   Resp 18   Ht 5\' 3"  (1.6 m)   Wt 80.7 kg   SpO2 97%   BMI 31.52 kg/m   OBJECTIVE:  Physical Exam   General: resting comfortably, NAD, alert Cardiac:RRR, no murmurs auscultated  Pulmonary:clear to auscultate b/l, normal effort  MSK:b/l edema improved after compression with SCD, discoloration unchanged. Right calcaneal ulcer is wrapped with dressing.  Abd: non distended, non tender, 2+ BS present Skin: warm and dry Psych: Pleasant mood, normal affect  Pertinent Labs, Studies, and Procedures:     Latest Ref Rng & Units 01/28/2023    2:47 AM 01/27/2023    2:51 AM 01/26/2023    7:37 AM  CBC  WBC 4.0 - 10.5 K/uL 6.6  7.1  6.0   Hemoglobin 12.0 - 15.0 g/dL 9.8  9.1  9.6   Hematocrit 36.0 - 46.0 % 31.0  29.2  29.8   Platelets 150 - 400 K/uL 429  410  450        Latest Ref Rng & Units 01/28/2023    2:47 AM 01/27/2023    2:51 AM 01/26/2023    7:37 AM  CMP  Glucose 70 - 99 mg/dL 981  96  82   BUN 8 - 23 mg/dL 25  25  16    Creatinine 0.44 - 1.00 mg/dL 1.91  4.78  2.95   Sodium 135 - 145 mmol/L 136  137  140   Potassium 3.5 - 5.1 mmol/L 4.7  4.3  4.3   Chloride 98 - 111 mmol/L 104  104  105   CO2 22 - 32 mmol/L 25  26  24    Calcium 8.9 - 10.3 mg/dL 8.6  8.5  8.7     MR FOOT RIGHT W WO CONTRAST  Result Date: 01/20/2023 CLINICAL DATA:  Diabetic foot swelling, gangrene, foot wound. EXAM: MRI OF THE  RIGHT FOOT WITHOUT AND WITH CONTRAST TECHNIQUE: Multiplanar, multisequence MR imaging of the right foot was performed before and after the administration of intravenous contrast. Osteomyelitis protocol MRI of the foot was obtained, to include the entire foot and ankle. This protocol uses a large field of view to cover the entire foot and ankle, and is suitable for assessing bony structures for osteomyelitis. Due to the large field of view and imaging plane choice, this protocol is less sensitive for assessing small structures such as ligamentous structures of the foot and ankle, compared to a dedicated forefoot or dedicated hindfoot exam. CONTRAST:  8mL GADAVIST GADOBUTROL 1 MMOL/ML IV SOLN COMPARISON:  Radiographs 01/19/2023 FINDINGS: Despite efforts by the technologist and patient, motion artifact is present on today's exam and could not be eliminated. This reduces exam sensitivity and specificity. Bones/Joint/Cartilage Metal artifact in the second toe likely related to the small needle shown on conventional radiography. Scattered degenerative findings in the hindfoot and midfoot. Degenerative subcortical cystic lesion along the base of the fourth metatarsal. Likely reactive endosteal edema and enhancement along the posterior plantar margin  of the calcaneus. This is in proximity to severe ulceration, but is less than I would expect in the setting of true osteomyelitis. Degenerative findings along the articulation of the navicular with the medial cuneiform. Ligaments Poorly characterized on today's large field of view examination. Muscles and Tendons Hypoenhancing ulceration along the plantar foot extends up to involve the medial band of the plantar fascia on image 19 series 7. Distal Achilles tendon intact. No other specific tendon abnormality observed. Generalized low-level muscular edema the foot, probably neurogenic. Soft tissues Large plantar heel ulceration with internal gas and hypoenhancement measuring  about 3.0 by 4.5 by 4.2 cm. This appears to be draining to the cutaneous surface, and extends to involve the medial band of the plantar fascia with subtle reactive edema in the endosteal region of the left posteroinferior calcaneus. Suspected second small focus of ulceration along the lateral plantar foot on image 9 series 11, about the level of the Lisfranc joint. Metal artifact in the second toe likely related to the known needle shaped foreign body. IMPRESSION: 1. Large plantar heel ulceration with internal gas and hypoenhancement measuring about 3.0 by 4.5 by 4.2 cm. This appears to be draining to the cutaneous surface, and extends to involve the medial band of the plantar fascia with subtle reactive edema in the endosteal region of the left posteroinferior calcaneus. 2. Suspected second small focus of ulceration along the lateral plantar foot about the level of the Lisfranc joint. 3. Metal artifact in the second toe likely related to the known needle shaped foreign body shown on conventional radiography. 4. Scattered degenerative findings in the hindfoot and midfoot. 5. Generalized low-level muscular edema the foot, probably neurogenic. Electronically Signed   By: Gaylyn Rong M.D.   On: 01/20/2023 16:12   VAS [place] ABI WITH/WO TBI  Result Date: 01/20/2023  LOWER EXTREMITY DOPPLER STUDY Patient Name:  FRANCIE ABELLERA  Date of Exam:   01/20/2023 Medical Rec #: [id number]      Accession #:    [id number] Date of Birth: 13-Nov-1930       Patient Gender: F Patient Age:   7 years Exam Location:  [facility] Procedure:      VAS [place] ABI WITH/WO TBI Referring Phys: JEFFREY HATCHER --------------------------------------------------------------------------------  Indications: Ulceration. High Risk Factors: Hypertension, hyperlipidemia, Diabetes.  Limitations: Today's exam was limited due to patient intolerant to cuff              pressure, an open wound, involuntary patient movement and skin               induration, L arm IV. Comparison Study: No prior studies. Performing Technologist: Olen Cordial RVT  Examination Guidelines: A complete evaluation includes at minimum, Doppler waveform signals and systolic blood pressure reading at the level of bilateral brachial, anterior tibial, and posterior tibial arteries, when vessel segments are accessible. Bilateral testing is considered an integral part of a complete examination. Photoelectric Plethysmograph (PPG) waveforms and toe systolic pressure readings are included as required and additional duplex testing as needed. Limited examinations for reoccurring indications may be performed as noted.  ABI Findings: +---------+------------------+-----+----------+--------+ Right    Rt Pressure (mmHg)IndexWaveform  Comment  +---------+------------------+-----+----------+--------+ Brachial                                  IV       +---------+------------------+-----+----------+--------+ PTA      247  1.56 monophasic         +---------+------------------+-----+----------+--------+ DP       130               0.82 monophasic         +---------+------------------+-----+----------+--------+ Great Toe51                0.32                    +---------+------------------+-----+----------+--------+ +---------+------------------+-----+----------+-------+ Left     Lt Pressure (mmHg)IndexWaveform  Comment +---------+------------------+-----+----------+-------+ Brachial 158                    triphasic         +---------+------------------+-----+----------+-------+ PTA      237               1.50 monophasic        +---------+------------------+-----+----------+-------+ DP       254               1.61 monophasic        +---------+------------------+-----+----------+-------+ Great Toe62                0.39                   +---------+------------------+-----+----------+-------+  +-------+-----------+-----------+------------+------------+ ABI/TBIToday's ABIToday's TBIPrevious ABIPrevious TBI +-------+-----------+-----------+------------+------------+ Right  [place]         0.32                                +-------+-----------+-----------+------------+------------+ Left   [place]         0.39                                +-------+-----------+-----------+------------+------------+   Summary: Right: Resting right ankle-brachial index indicates noncompressible right lower extremity arteries. The right toe-brachial index is abnormal. Left: Resting left ankle-brachial index indicates noncompressible left lower extremity arteries. The left toe-brachial index is abnormal. *See table(s) above for measurements and observations.  Electronically signed by Sherald Hess MD on 01/20/2023 at 2:10:57 PM.    Final    ECHOCARDIOGRAM COMPLETE  Result Date: 01/20/2023    ECHOCARDIOGRAM REPORT   Patient Name:   CHARYSSE ISERI Date of Exam: 01/20/2023 Medical Rec #:  [id number]     Height:       63.0 in Accession #:    [id number]    Weight:       182.1 lb Date of Birth:  1931/03/03      BSA:          1.858 m Patient Age:    89 years      BP:           162/96 mmHg Patient Gender: F             HR:           81 bpm. Exam Location:  Inpatient Procedure: 2D Echo, Cardiac Doppler and Color Doppler Indications:    Other abnormalities of the heart R00.8  History:        Patient has no prior history of Echocardiogram examinations.                 PAD; Risk Factors:Non-Smoker, Diabetes and Dyslipidemia.  Sonographer:    Dondra Prader RVT RCS Referring Phys: [id number] JEFFREY C HATCHER  Sonographer Comments: Patient  done in supine position IMPRESSIONS  1. Left ventricular ejection fraction, by estimation, is 60 to 65%. The left ventricle has normal function. The left ventricle has no regional wall motion abnormalities. Left ventricular diastolic parameters are consistent with Grade I diastolic dysfunction  (impaired relaxation).  2. Right ventricular systolic function is normal. The right ventricular size is normal.  3. Left atrial size was moderately dilated.  4. The mitral valve is normal in structure. Mild mitral valve regurgitation. No evidence of mitral stenosis.  5. The aortic valve is tricuspid. There is mild calcification of the aortic valve. Aortic valve regurgitation is not visualized. Aortic valve sclerosis/calcification is present, without any evidence of aortic stenosis.  6. The inferior vena cava is normal in size with greater than 50% respiratory variability, suggesting right atrial pressure of 3 mmHg. FINDINGS  Left Ventricle: Left ventricular ejection fraction, by estimation, is 60 to 65%. The left ventricle has normal function. The left ventricle has no regional wall motion abnormalities. The left ventricular internal cavity size was normal in size. There is  no left ventricular hypertrophy. Left ventricular diastolic parameters are consistent with Grade I diastolic dysfunction (impaired relaxation). Right Ventricle: The right ventricular size is normal. No increase in right ventricular wall thickness. Right ventricular systolic function is normal. Left Atrium: Left atrial size was moderately dilated. Right Atrium: Right atrial size was normal in size. Pericardium: There is no evidence of pericardial effusion. Mitral Valve: The mitral valve is normal in structure. Mild mitral valve regurgitation. No evidence of mitral valve stenosis. Tricuspid Valve: The tricuspid valve is normal in structure. Tricuspid valve regurgitation is trivial. No evidence of tricuspid stenosis. Aortic Valve: The aortic valve is tricuspid. There is mild calcification of the aortic valve. Aortic valve regurgitation is not visualized. Aortic valve sclerosis/calcification is present, without any evidence of aortic stenosis. Aortic valve mean gradient measures 6.0 mmHg. Aortic valve peak gradient measures 10.2 mmHg. Aortic valve  area, by VTI measures 2.36 cm. Pulmonic Valve: The pulmonic valve was normal in structure. Pulmonic valve regurgitation is not visualized. No evidence of pulmonic stenosis. Aorta: The aortic root is normal in size and structure. Venous: The inferior vena cava is normal in size with greater than 50% respiratory variability, suggesting right atrial pressure of 3 mmHg. IAS/Shunts: No atrial level shunt detected by color flow Doppler.  LEFT VENTRICLE PLAX 2D LVIDd:         5.10 cm   Diastology LVIDs:         3.80 cm   LV Dudley' medial:    7.40 cm/s LV PW:         0.90 cm   LV Dudley/Dudley' medial:  10.7 LV IVS:        1.00 cm   LV Dudley' lateral:   7.94 cm/s LVOT diam:     2.10 cm   LV Dudley/Dudley' lateral: 9.9 LV SV:         80 LV SV Index:   43 LVOT Area:     3.46 cm  RIGHT VENTRICLE RV Basal diam:  3.00 cm LEFT ATRIUM             Index        RIGHT ATRIUM           Index LA diam:        4.50 cm 2.42 cm/m   RA Area:     10.40 cm LA Vol (A2C):   54.9 ml 29.55 ml/m  RA Volume:  21.00 ml  11.30 ml/m LA Vol (A4C):   44.6 ml 24.00 ml/m LA Biplane Vol: 52.3 ml 28.15 ml/m  AORTIC VALVE                     PULMONIC VALVE AV Area (Vmax):    2.29 cm      PV Vmax:       1.18 m/s AV Area (Vmean):   2.31 cm      PV Peak grad:  5.6 mmHg AV Area (VTI):     2.36 cm AV Vmax:           160.00 cm/s AV Vmean:          108.000 cm/s AV VTI:            0.341 m AV Peak Grad:      10.2 mmHg AV Mean Grad:      6.0 mmHg LVOT Vmax:         106.00 cm/s LVOT Vmean:        71.900 cm/s LVOT VTI:          0.232 m LVOT/AV VTI ratio: 0.68  AORTA Ao Root diam: 2.50 cm Ao Asc diam:  3.50 cm MITRAL VALVE MV Area (PHT): 3.85 cm     SHUNTS MV Decel Time: 197 msec     Systemic VTI:  0.23 m MV Dudley velocity: 78.90 cm/s   Systemic Diam: 2.10 cm MV A velocity: 123.00 cm/s MV Dudley/A ratio:  0.64 Arvilla Meres MD Electronically signed by Arvilla Meres MD Signature Date/Time: 01/20/2023/12:05:09 PM    Final    DG Foot Complete Right  Result Date: 01/19/2023 CLINICAL DATA:   Pain in right foot. EXAM: RIGHT FOOT COMPLETE - 3+ VIEW COMPARISON:  None Available. FINDINGS: There is marked diffuse soft tissue edema about the right foot. Soft tissue ulceration overlying the plantar surface of the calcaneus is identified with overlying bandage material and subcutaneous gas. No signs focal bony erosion of the calcaneus. Suspected needle fragment noted within the soft tissues along the plantar surface of the second digit at the level of the second middle phalanx Second through fifth hammertoe deformities. No signs of acute fracture. Degenerative changes are identified between the tarsal bones. IMPRESSION: 1. Soft tissue ulceration overlying the plantar surface of the calcaneus with overlying bandage material and subcutaneous gas. 2. No signs of focal bony erosion to suggest osteomyelitis. 3. Suspected needle fragment within the soft tissues along the plantar surface of the second digit at the level of the second middle phalanx. 4. Marked diffuse soft tissue edema about the right foot. 5. Second through fifth hammertoe deformities. Electronically Signed   By: Signa Kell M.D.   On: 01/19/2023 15:10     Signed: Jeral Pinch, D.O.  Internal Medicine Resident, PGY-1 [facility]  Pager: [phone] 09:05AM, 01/31/2023

## 2023-01-30 NOTE — Progress Notes (Signed)
Physical Therapy Treatment Patient Details Name: Amber Dudley MRN: [id number] DOB: May 26, 1931 Today's Date: 01/30/2023   History of Present Illness 89 year old woman presents to ED 7/18 after hitting R foot causing increased bleeding 2 days prior and onset of increased pain in weight bearing on R foot. Reports no lack of sensation or neuropathy but is unaware of needle embedded in R 5th metatarsal. Found to have chronic diabetic R foot ulcer. Xrays reveal soft tissue ulceration overlying plantar surface of calcaneus and subcutaneous gas but no focal bony erosions. Admitted for observation and development of plan of care. PMH:HTN, Insulin dependent T2DM, Hypothyroidism, and chronic lower extremity edema    PT Comments  Pt seated in recliner.  Reports limited mobility over weekend.  Performed gt with youth height RW which improved posture.  Pt also tolerated LE there ex.  Plan for SNF continues.      If plan is discharge home, recommend the following: A little help with walking and/or transfers;A little help with bathing/dressing/bathroom;Assistance with cooking/housework;Assist for transportation;Help with stairs or ramp for entrance   Can travel by private vehicle     Yes  Equipment Recommendations  None recommended by PT;Rolling walker (2 wheels)    Recommendations for Other Services       Precautions / Restrictions Precautions Precautions: Fall Precaution Comments: fall 2 year prior Restrictions Weight Bearing Restrictions: No     Mobility  Bed Mobility               General bed mobility comments: Seated in recliner on arrival.    Transfers Overall transfer level: Needs assistance Equipment used: Rolling walker (2 wheels) Transfers: Sit to/from Stand Sit to Stand: Supervision           General transfer comment: Cues for hand placement and backing to seated surface with RW before sitting.    Ambulation/Gait Ambulation/Gait assistance: Min guard Gait Distance  (Feet): 125 Feet (x2 seated rest break mid trial to switch from standard height RW to youth height RW to improve posture.) Assistive device: Rolling walker (2 wheels) (youth height) Gait Pattern/deviations: Knee flexed in stance - right, Knee flexed in stance - left, Decreased step length - right, Decreased step length - left, Shuffle, Trunk flexed, Narrow base of support, Decreased weight shift to right       General Gait Details: Pt continues with flexed posture.  Switched RW to youth height and improved posture noted.  Pt with mild DOE.   Stairs             Wheelchair Mobility     Tilt Bed    Modified Rankin (Stroke Patients Only)       Balance Overall balance assessment: Needs assistance Sitting-balance support: Feet supported, No upper extremity supported Sitting balance-Leahy Scale: Fair       Standing balance-Leahy Scale: Poor                              Cognition Arousal/Alertness: Awake/alert Behavior During Therapy: WFL for tasks assessed/performed Overall Cognitive Status: Within Functional Limits for tasks assessed                                          Exercises General Exercises - Lower Extremity Quad Sets: AROM, Both, 10 reps, Supine Long Arc Quad: AROM, Both, 10 reps, Seated Hip  ABduction/ADduction: AROM, Both, 10 reps, Supine Straight Leg Raises: AROM, Both, 10 reps, Supine Hip Flexion/Marching: AROM, Both, 10 reps, Seated    General Comments        Pertinent Vitals/Pain Pain Assessment Pain Assessment: No/denies pain    Home Living                          Prior Function            PT Goals (current goals can now be found in the care plan section) Acute Rehab PT Goals Patient Stated Goal: to be independent Potential to Achieve Goals: Fair Progress towards PT goals: Progressing toward goals    Frequency    Min 1X/week      PT Plan Frequency needs to be updated     Co-evaluation              AM-PAC PT "6 Clicks" Mobility   Outcome Measure  Help needed turning from your back to your side while in a flat bed without using bedrails?: None Help needed moving from lying on your back to sitting on the side of a flat bed without using bedrails?: A Little Help needed moving to and from a bed to a chair (including a wheelchair)?: A Little Help needed standing up from a chair using your arms (e.g., wheelchair or bedside chair)?: A Little Help needed to walk in hospital room?: A Little Help needed climbing 3-5 steps with a railing? : A Lot 6 Click Score: 18    End of Session Equipment Utilized During Treatment: Gait belt Activity Tolerance: Patient tolerated treatment well Patient left: with call bell/phone within reach;in chair;with family/visitor present Nurse Communication: Mobility status PT Visit Diagnosis: Unsteadiness on feet (R26.81);Other abnormalities of gait and mobility (R26.89);Muscle weakness (generalized) (M62.81);Difficulty in walking, not elsewhere classified (R26.2);Pain Pain - Right/Left: Right Pain - part of body: Ankle and joints of foot     Time: 4782-9562 PT Time Calculation (min) (ACUTE ONLY): 21 min  Charges:    $Gait Training: 8-22 mins PT General Charges $$ ACUTE PT VISIT: 1 Visit                     Bonney Leitz , PTA Acute Rehabilitation Services Office [phone]    Florestine Avers 01/30/2023, 3:33 PM

## 2023-01-30 NOTE — Plan of Care (Signed)
  Problem: Education: Goal: Ability to describe self-care measures that may prevent or decrease complications (Diabetes Survival Skills Education) will improve Outcome: Progressing Goal: Individualized Educational Video(s) Outcome: Progressing   Problem: Coping: Goal: Ability to adjust to condition or change in health will improve Outcome: Progressing   Problem: Fluid Volume: Goal: Ability to maintain a balanced intake and output will improve Outcome: Progressing   Problem: Health Behavior/Discharge Planning: Goal: Ability to identify and utilize available resources and services will improve Outcome: Progressing Goal: Ability to manage health-related needs will improve Outcome: Progressing   Problem: Metabolic: Goal: Ability to maintain appropriate glucose levels will improve Outcome: Progressing   Problem: Nutritional: Goal: Maintenance of adequate nutrition will improve Outcome: Progressing Goal: Progress toward achieving an optimal weight will improve Outcome: Progressing   Problem: Skin Integrity: Goal: Risk for impaired skin integrity will decrease Outcome: Progressing   Problem: Tissue Perfusion: Goal: Adequacy of tissue perfusion will improve Outcome: Progressing   Problem: Education: Goal: Knowledge of General Education information will improve Description: Including pain rating scale, medication(s)/side effects and non-pharmacologic comfort measures Outcome: Progressing   Problem: Health Behavior/Discharge Planning: Goal: Ability to manage health-related needs will improve Outcome: Progressing   Problem: Clinical Measurements: Goal: Ability to maintain clinical measurements within normal limits will improve Outcome: Progressing Goal: Will remain free from infection Outcome: Progressing Goal: Diagnostic test results will improve Outcome: Progressing Goal: Cardiovascular complication will be avoided Outcome: Progressing   Problem: Activity: Goal: Risk  for activity intolerance will decrease Outcome: Progressing   Problem: Nutrition: Goal: Adequate nutrition will be maintained Outcome: Progressing   Problem: Coping: Goal: Level of anxiety will decrease Outcome: Progressing   Problem: Elimination: Goal: Will not experience complications related to bowel motility Outcome: Progressing Goal: Will not experience complications related to urinary retention Outcome: Progressing   Problem: Pain Managment: Goal: General experience of comfort will improve Outcome: Progressing   Problem: Safety: Goal: Ability to remain free from injury will improve Outcome: Progressing   Problem: Skin Integrity: Goal: Risk for impaired skin integrity will decrease Outcome: Progressing

## 2023-01-31 DIAGNOSIS — Z515 Encounter for palliative care: Secondary | ICD-10-CM | POA: Diagnosis not present

## 2023-01-31 DIAGNOSIS — E1152 Type 2 diabetes mellitus with diabetic peripheral angiopathy with gangrene: Secondary | ICD-10-CM | POA: Diagnosis not present

## 2023-01-31 DIAGNOSIS — Z794 Long term (current) use of insulin: Secondary | ICD-10-CM | POA: Diagnosis not present

## 2023-01-31 LAB — GLUCOSE, CAPILLARY
Glucose-Capillary: 87 mg/dL (ref 70–99)
Glucose-Capillary: 187 mg/dL — ABNORMAL HIGH (ref 70–99)

## 2023-01-31 MED ORDER — CYANOCOBALAMIN 500 MCG PO TABS: 500.0000 ug | ORAL_TABLET | Freq: Every day | ORAL | Status: AC

## 2023-01-31 MED ORDER — GABAPENTIN 100 MG PO CAPS: 100.0000 mg | ORAL_CAPSULE | Freq: Every day | ORAL | Status: AC

## 2023-01-31 MED ORDER — FUROSEMIDE 20 MG PO TABS: 20.0000 mg | ORAL_TABLET | Freq: Every day | ORAL | Status: AC

## 2023-01-31 MED ORDER — MEDIHONEY WOUND/BURN DRESSING EX PSTE: 1.0000 | PASTE | Freq: Every day | CUTANEOUS | Status: AC

## 2023-01-31 MED ORDER — DOXYCYCLINE HYCLATE 100 MG PO CAPS: 100.0000 mg | ORAL_CAPSULE | Freq: Two times a day (BID) | ORAL | Status: AC

## 2023-01-31 MED ORDER — LEVOTHYROXINE SODIUM 137 MCG PO TABS: 68.5000 ug | ORAL_TABLET | Freq: Every day | ORAL | Status: AC

## 2023-01-31 MED ORDER — METOPROLOL SUCCINATE ER 100 MG PO TB24: 100.0000 mg | ORAL_TABLET | Freq: Every day | ORAL | Status: AC

## 2023-01-31 MED ORDER — PROBIOTIC 1-250 BILLION-MG PO CAPS: 1.0000 | ORAL_CAPSULE | Freq: Every day | ORAL | Status: DC

## 2023-01-31 MED ORDER — AMOXICILLIN-POT CLAVULANATE 875-125 MG PO TABS: 1.0000 | ORAL_TABLET | Freq: Two times a day (BID) | ORAL | Status: AC

## 2023-01-31 MED ORDER — AMLODIPINE BESYLATE 10 MG PO TABS: 10.0000 mg | ORAL_TABLET | Freq: Every day | ORAL | Status: AC

## 2023-01-31 MED ORDER — PROBIOTIC 1-250 BILLION-MG PO CAPS: 1.0000 | ORAL_CAPSULE | Freq: Every day | ORAL | Status: AC

## 2023-01-31 NOTE — TOC Transition Note (Signed)
Transition of Care [facility]) - CM/SW Discharge Note   Patient Details  Name: Amber Dudley MRN: [id number] Date of Birth: May 02, 1931  [facility]) CM/SW Contact:  Lorri Frederick, LCSW Phone Number: 01/31/2023, 10:32 AM   Clinical Narrative:   Pt discharging to [facility], [place], room 16.  RN call report to [phone].  Pt son Amber Dudley will transport pt to SNF.  Pt may need to be brought down to main north tower entrance with assistance into the vehicle.      Final next level of care: Skilled Nursing Facility Barriers to Discharge: Barriers Resolved   Patient Goals and CMS Choice CMS Medicare.gov Compare Post Acute Care list provided to:: Patient Choice offered to / list presented to : Patient  Discharge Placement                Patient chooses bed at:  [facility]) Patient to be transferred to facility by: son Amber Dudley Name of family member notified: son Amber Dudley Patient and family notified of of transfer: 01/31/23  Discharge Plan and Services Additional resources added to the After Visit Summary for                            [facility] Arranged: RN, PT, OT, Patient Refused HH, Social Work [employer] Agency: [facility] Date [facility] Agency Contacted: 01/26/23 Time HH Agency Contacted: 1607 Representative spoke with at [facility] Agency: Kandee Keen  Social Determinants of Health (SDOH) Interventions SDOH Screenings   Tobacco Use: Low Risk  (01/19/2023)     Readmission Risk Interventions     No data to display

## 2023-01-31 NOTE — Plan of Care (Signed)
  Problem: Education: Goal: Ability to describe self-care measures that may prevent or decrease complications (Diabetes Survival Skills Education) will improve Outcome: Adequate for Discharge Goal: Individualized Educational Video(s) Outcome: Adequate for Discharge   Problem: Coping: Goal: Ability to adjust to condition or change in health will improve Outcome: Adequate for Discharge   Problem: Fluid Volume: Goal: Ability to maintain a balanced intake and output will improve Outcome: Adequate for Discharge   Problem: Health Behavior/Discharge Planning: Goal: Ability to identify and utilize available resources and services will improve Outcome: Adequate for Discharge Goal: Ability to manage health-related needs will improve Outcome: Adequate for Discharge   Problem: Metabolic: Goal: Ability to maintain appropriate glucose levels will improve Outcome: Adequate for Discharge   Problem: Nutritional: Goal: Maintenance of adequate nutrition will improve Outcome: Adequate for Discharge Goal: Progress toward achieving an optimal weight will improve Outcome: Adequate for Discharge   Problem: Skin Integrity: Goal: Risk for impaired skin integrity will decrease Outcome: Adequate for Discharge   Problem: Tissue Perfusion: Goal: Adequacy of tissue perfusion will improve Outcome: Adequate for Discharge   Problem: Education: Goal: Knowledge of General Education information will improve Description: Including pain rating scale, medication(s)/side effects and non-pharmacologic comfort measures Outcome: Adequate for Discharge   Problem: Health Behavior/Discharge Planning: Goal: Ability to manage health-related needs will improve Outcome: Adequate for Discharge   Problem: Clinical Measurements: Goal: Ability to maintain clinical measurements within normal limits will improve Outcome: Adequate for Discharge Goal: Will remain free from infection Outcome: Adequate for Discharge Goal:  Diagnostic test results will improve Outcome: Adequate for Discharge Goal: Cardiovascular complication will be avoided Outcome: Adequate for Discharge   Problem: Activity: Goal: Risk for activity intolerance will decrease Outcome: Adequate for Discharge   Problem: Nutrition: Goal: Adequate nutrition will be maintained Outcome: Adequate for Discharge   Problem: Coping: Goal: Level of anxiety will decrease Outcome: Adequate for Discharge   Problem: Elimination: Goal: Will not experience complications related to bowel motility Outcome: Adequate for Discharge Goal: Will not experience complications related to urinary retention Outcome: Adequate for Discharge   Problem: Pain Managment: Goal: General experience of comfort will improve Outcome: Adequate for Discharge   Problem: Safety: Goal: Ability to remain free from injury will improve Outcome: Adequate for Discharge   Problem: Skin Integrity: Goal: Risk for impaired skin integrity will decrease Outcome: Adequate for Discharge   Problem: Acute Rehab OT Goals (only OT should resolve) Goal: Pt. Will Perform Grooming Outcome: Adequate for Discharge Goal: Pt. Will Perform Upper Body Bathing Outcome: Adequate for Discharge Goal: Pt. Will Transfer To Toilet Outcome: Adequate for Discharge Goal: Pt. Will Perform Toileting-Clothing Manipulation Outcome: Adequate for Discharge Goal: Pt/Caregiver Will Perform Home Exercise Program Outcome: Adequate for Discharge Goal: OT Additional ADL Goal #1 Outcome: Adequate for Discharge   Problem: Acute Rehab PT Goals(only PT should resolve) Goal: Pt Will Go Supine/Side To Sit Outcome: Adequate for Discharge Goal: Patient Will Transfer Sit To/From Stand Outcome: Adequate for Discharge Goal: Pt Will Transfer Bed To Chair/Chair To Bed Outcome: Adequate for Discharge Goal: Pt Will Ambulate Outcome: Adequate for Discharge

## 2023-01-31 NOTE — Progress Notes (Signed)
Occupational Therapy Treatment Patient Details Name: Amber Dudley MRN: [id number] DOB: 07/18/1930 Today's Date: 01/31/2023   History of present illness 89 year old woman presents to ED 7/18 after hitting R foot causing increased bleeding 2 days prior and onset of increased pain in weight bearing on R foot. Reports no lack of sensation or neuropathy but is unaware of needle embedded in R 5th metatarsal. Found to have chronic diabetic R foot ulcer. Xrays reveal soft tissue ulceration overlying plantar surface of calcaneus and subcutaneous gas but no focal bony erosions. Admitted for observation and development of plan of care. PMH:HTN, Insulin dependent T2DM, Hypothyroidism, and chronic lower extremity edema   OT comments  Pt continuing to progress in OT sessions, completing dressing bADLS with min guard to set up assist. Pt ambulating with min guard and still exhibits generalized weakness. Pt does continue to require cues for safety, she loves to pull herself up on RW despite consistent OT cueing to use hands on supported surface to push off. Reinforced pt complete AROM exercises of BLEs for strengthening, she verbalized that she does continue to do that outside of therapy. OT to continue to progress pt as able, DC plans remain appropriate for SNF.   Recommendations for follow up therapy are one component of a multi-disciplinary discharge planning process, led by the attending physician.  Recommendations may be updated based on patient status, additional functional criteria and insurance authorization.    Assistance Recommended at Discharge Set up Supervision/Assistance  Patient can return home with the following  A little help with walking and/or transfers;A little help with bathing/dressing/bathroom;Help with stairs or ramp for entrance;Assist for transportation   Equipment Recommendations  BSC/3in1    Recommendations for Other Services      Precautions / Restrictions Precautions Precautions:  Fall Precaution Comments: fall 2 year prior Restrictions Weight Bearing Restrictions: No       Mobility Bed Mobility               General bed mobility comments: Pt received and left sitting in recliner    Transfers Overall transfer level: Needs assistance Equipment used: Rolling walker (2 wheels) Transfers: Sit to/from Stand Sit to Stand: Supervision                 Balance Overall balance assessment: Needs assistance Sitting-balance support: Feet supported, No upper extremity supported Sitting balance-Leahy Scale: Fair     Standing balance support: During functional activity, Reliant on assistive device for balance, Bilateral upper extremity supported Standing balance-Leahy Scale: Poor                             ADL either performed or assessed with clinical judgement   ADL Overall ADL's : Needs assistance/impaired Eating/Feeding: Independent;Sitting               Upper Body Dressing : Sitting;Set up Upper Body Dressing Details (indicate cue type and reason): donned shirt Lower Body Dressing: Min guard;Sit to/from stand Lower Body Dressing Details (indicate cue type and reason): don and doff bilat shoes, underwear and pants Toilet Transfer: Min guard;Rolling walker (2 wheels);Ambulation;Grab bars Toilet Transfer Details (indicate cue type and reason): cues for hand placement on grab bar instead of toilet paper holder when going to sit Toileting- Clothing Manipulation and Hygiene: Sit to/from stand;Min guard       Functional mobility during ADLs: Min guard;Rolling walker (2 wheels)      Extremity/Trunk Assessment  Vision       Perception     Praxis      Cognition Arousal/Alertness: Awake/alert Behavior During Therapy: WFL for tasks assessed/performed Overall Cognitive Status: Impaired/Different from baseline Area of Impairment: Safety/judgement                         Safety/Judgement: Decreased  awareness of safety     General Comments: reinforced RW safety with use and reaching for supported surfaces when pushing off for STS transition        Exercises      Shoulder Instructions       General Comments VSS on RA    Pertinent Vitals/ Pain       Pain Assessment Pain Assessment: Faces Faces Pain Scale: Hurts a little bit Pain Location: Bilat legs Pain Descriptors / Indicators: Aching Pain Intervention(s): Monitored during session, Repositioned, Limited activity within patient's tolerance   Frequency  Min 1X/week        Progress Toward Goals  OT Goals(current goals can now be found in the care plan section)  Progress towards OT goals: Progressing toward goals  Acute Rehab OT Goals Patient Stated Goal: To keep doing better OT Goal Formulation: With patient Time For Goal Achievement: 02/03/23 Potential to Achieve Goals: Good  Plan Discharge plan remains appropriate;Frequency remains appropriate       AM-PAC OT "6 Clicks" Daily Activity     Outcome Measure   Help from another person eating meals?: None Help from another person taking care of personal grooming?: A Little Help from another person toileting, which includes using toliet, bedpan, or urinal?: A Little Help from another person bathing (including washing, rinsing, drying)?: A Little Help from another person to put on and taking off regular upper body clothing?: A Little Help from another person to put on and taking off regular lower body clothing?: A Little 6 Click Score: 19    End of Session Equipment Utilized During Treatment: Gait belt;Rolling walker (2 wheels)  OT Visit Diagnosis: Muscle weakness (generalized) (M62.81)   Activity Tolerance Patient tolerated treatment well   Patient Left in chair;with call bell/phone within reach;with family/visitor present   Nurse Communication Mobility status        Time: 1248-1310 OT Time Calculation (min): 22 min  Charges: OT General  Charges $OT Visit: 1 Visit OT Treatments $Self Care/Home Management : 8-22 mins  01/31/2023  AB, OTR/L  Acute Rehabilitation Services  Office: [phone]   Tristan Schroeder 01/31/2023, 1:30 PM

## 2023-01-31 NOTE — TOC Progression Note (Signed)
Transition of Care [facility]) - Progression Note    Patient Details  Name: Amber Dudley MRN: [id number] Date of Birth: 1930-12-14  [facility] of [facility]) CM/SW Contact  Lorri Frederick, LCSW Phone Number: 01/31/2023, 10:30 AM  Clinical Narrative:   CSW confirmed with Patton Salles that [facility] can receive pt today.  CSW spoke with pt so [name], who is aware of DC.  He is able to provide transportation to SNF.    Expected Discharge Plan: Skilled Nursing Facility Barriers to Discharge: SNF Pending bed offer  Expected Discharge Plan and Services       Living arrangements for the past 2 months: Single Family Home Expected Discharge Date: 01/30/23                         HH Arranged: RN, PT, OT, Patient Refused HH, Social Work [employer] Agency: [facility] Date [facility] Agency Contacted: 01/26/23 Time HH Agency Contacted: [phone] Representative spoke with at [facility] Agency: Kandee Keen   Social Determinants of Health (SDOH) Interventions SDOH Screenings   Tobacco Use: Low Risk  (01/19/2023)    Readmission Risk Interventions     No data to display

## 2023-01-31 NOTE — Plan of Care (Signed)
  Problem: Coping: Goal: Ability to adjust to condition or change in health will improve Outcome: Progressing   Problem: Health Behavior/Discharge Planning: Goal: Ability to manage health-related needs will improve Outcome: Progressing   Problem: Nutritional: Goal: Maintenance of adequate nutrition will improve Outcome: Progressing

## 2023-01-31 NOTE — Progress Notes (Signed)
Called and gave report to RN at [facility], [place].

## 2023-11-13 ENCOUNTER — Encounter: Attending: Physician Assistant | Admitting: Physician Assistant

## 2023-11-13 DIAGNOSIS — I890 Lymphedema, not elsewhere classified: Secondary | ICD-10-CM | POA: Insufficient documentation

## 2023-11-13 DIAGNOSIS — M6281 Muscle weakness (generalized): Secondary | ICD-10-CM | POA: Diagnosis not present

## 2023-11-13 DIAGNOSIS — E1140 Type 2 diabetes mellitus with diabetic neuropathy, unspecified: Secondary | ICD-10-CM | POA: Insufficient documentation

## 2023-11-13 DIAGNOSIS — L97415 Non-pressure chronic ulcer of right heel and midfoot with muscle involvement without evidence of necrosis: Secondary | ICD-10-CM | POA: Diagnosis not present

## 2023-11-13 DIAGNOSIS — I10 Essential (primary) hypertension: Secondary | ICD-10-CM | POA: Insufficient documentation

## 2023-11-13 DIAGNOSIS — E11621 Type 2 diabetes mellitus with foot ulcer: Secondary | ICD-10-CM | POA: Diagnosis present

## 2023-11-24 ENCOUNTER — Ambulatory Visit: Admitting: Physician Assistant

## 2023-11-28 ENCOUNTER — Encounter: Admitting: Physician Assistant

## 2023-11-28 DIAGNOSIS — E11621 Type 2 diabetes mellitus with foot ulcer: Secondary | ICD-10-CM | POA: Diagnosis not present

## 2023-12-08 ENCOUNTER — Ambulatory Visit: Admitting: Physician Assistant

## 2023-12-22 ENCOUNTER — Encounter: Attending: Internal Medicine | Admitting: Internal Medicine

## 2023-12-22 DIAGNOSIS — E11621 Type 2 diabetes mellitus with foot ulcer: Secondary | ICD-10-CM | POA: Diagnosis present

## 2023-12-22 DIAGNOSIS — I10 Essential (primary) hypertension: Secondary | ICD-10-CM | POA: Diagnosis not present

## 2023-12-22 DIAGNOSIS — M6281 Muscle weakness (generalized): Secondary | ICD-10-CM | POA: Insufficient documentation

## 2023-12-22 DIAGNOSIS — L97415 Non-pressure chronic ulcer of right heel and midfoot with muscle involvement without evidence of necrosis: Secondary | ICD-10-CM | POA: Diagnosis not present

## 2024-01-12 ENCOUNTER — Encounter: Attending: Physician Assistant | Admitting: Physician Assistant

## 2024-01-12 DIAGNOSIS — I890 Lymphedema, not elsewhere classified: Secondary | ICD-10-CM | POA: Diagnosis not present

## 2024-01-12 DIAGNOSIS — M6281 Muscle weakness (generalized): Secondary | ICD-10-CM | POA: Insufficient documentation

## 2024-01-12 DIAGNOSIS — E11621 Type 2 diabetes mellitus with foot ulcer: Secondary | ICD-10-CM | POA: Insufficient documentation

## 2024-01-12 DIAGNOSIS — L97415 Non-pressure chronic ulcer of right heel and midfoot with muscle involvement without evidence of necrosis: Secondary | ICD-10-CM | POA: Insufficient documentation

## 2024-01-12 DIAGNOSIS — I10 Essential (primary) hypertension: Secondary | ICD-10-CM | POA: Insufficient documentation

## 2024-01-26 ENCOUNTER — Encounter: Admitting: Physician Assistant

## 2024-02-02 ENCOUNTER — Ambulatory Visit: Admitting: Physician Assistant

## 2024-02-09 ENCOUNTER — Ambulatory Visit: Admitting: Physician Assistant

## 2024-02-23 ENCOUNTER — Encounter: Attending: Physician Assistant | Admitting: Physician Assistant

## 2024-02-23 DIAGNOSIS — L84 Corns and callosities: Secondary | ICD-10-CM | POA: Insufficient documentation

## 2024-02-23 DIAGNOSIS — I890 Lymphedema, not elsewhere classified: Secondary | ICD-10-CM | POA: Diagnosis not present

## 2024-02-23 DIAGNOSIS — L97415 Non-pressure chronic ulcer of right heel and midfoot with muscle involvement without evidence of necrosis: Secondary | ICD-10-CM | POA: Insufficient documentation

## 2024-02-23 DIAGNOSIS — M6281 Muscle weakness (generalized): Secondary | ICD-10-CM | POA: Insufficient documentation

## 2024-02-23 DIAGNOSIS — I10 Essential (primary) hypertension: Secondary | ICD-10-CM | POA: Diagnosis not present

## 2024-02-23 DIAGNOSIS — E1140 Type 2 diabetes mellitus with diabetic neuropathy, unspecified: Secondary | ICD-10-CM | POA: Diagnosis not present

## 2024-02-23 DIAGNOSIS — E11621 Type 2 diabetes mellitus with foot ulcer: Secondary | ICD-10-CM | POA: Diagnosis present

## 2024-03-08 ENCOUNTER — Ambulatory Visit: Admitting: Physician Assistant

## 2024-03-22 ENCOUNTER — Ambulatory Visit: Admitting: Physician Assistant

## 2024-04-05 ENCOUNTER — Encounter: Attending: Physician Assistant | Admitting: Physician Assistant

## 2024-04-05 DIAGNOSIS — M6281 Muscle weakness (generalized): Secondary | ICD-10-CM | POA: Insufficient documentation

## 2024-04-05 DIAGNOSIS — I890 Lymphedema, not elsewhere classified: Secondary | ICD-10-CM | POA: Diagnosis not present

## 2024-04-05 DIAGNOSIS — I10 Essential (primary) hypertension: Secondary | ICD-10-CM | POA: Insufficient documentation

## 2024-04-05 DIAGNOSIS — E11621 Type 2 diabetes mellitus with foot ulcer: Secondary | ICD-10-CM | POA: Insufficient documentation

## 2024-04-05 DIAGNOSIS — L97415 Non-pressure chronic ulcer of right heel and midfoot with muscle involvement without evidence of necrosis: Secondary | ICD-10-CM | POA: Insufficient documentation

## 2024-05-10 ENCOUNTER — Ambulatory Visit: Admitting: Internal Medicine
